# Patient Record
Sex: Female | Born: 1950 | Race: White | Hispanic: No | Marital: Married | Smoking: Former smoker
Health system: Southern US, Community
[De-identification: ages and names within clinical notes are randomized; demographics above are authoritative.]

## PROBLEM LIST (undated history)

## (undated) DIAGNOSIS — C801 Malignant (primary) neoplasm, unspecified: Secondary | ICD-10-CM

## (undated) DIAGNOSIS — I10 Essential (primary) hypertension: Secondary | ICD-10-CM

## (undated) HISTORY — PX: TUBAL LIGATION: SHX77

## (undated) HISTORY — PX: APPENDECTOMY: SHX54

## (undated) HISTORY — PX: COLOSTOMY REVERSAL: SHX5782

## (undated) HISTORY — PX: COLON RESECTION: SHX5231

## (undated) HISTORY — PX: ABDOMINAL HYSTERECTOMY: SHX81

## (undated) HISTORY — PX: SHOULDER ARTHROSCOPY: SHX128

## (undated) HISTORY — PX: COLOSTOMY: SHX63

## (undated) HISTORY — PX: COLON SURGERY: SHX602

---

## 2010-05-29 ENCOUNTER — Emergency Department (HOSPITAL_COMMUNITY)
Admission: EM | Admit: 2010-05-29 | Discharge: 2010-05-29 | Payer: Self-pay | Source: Home / Self Care | Admitting: Emergency Medicine

## 2010-12-01 ENCOUNTER — Other Ambulatory Visit (HOSPITAL_COMMUNITY): Payer: Self-pay | Admitting: *Deleted

## 2010-12-01 DIAGNOSIS — M25519 Pain in unspecified shoulder: Secondary | ICD-10-CM

## 2010-12-04 ENCOUNTER — Inpatient Hospital Stay (HOSPITAL_COMMUNITY)
Admission: RE | Admit: 2010-12-04 | Discharge: 2010-12-04 | Disposition: A | Discharge status: Home / Self Care | Payer: Medicare Other | Source: Ambulatory Visit | Attending: *Deleted | Admitting: *Deleted

## 2010-12-25 ENCOUNTER — Other Ambulatory Visit (HOSPITAL_COMMUNITY): Payer: Medicare Other

## 2011-01-15 ENCOUNTER — Ambulatory Visit (HOSPITAL_COMMUNITY)
Admission: RE | Admit: 2011-01-15 | Discharge: 2011-01-15 | Disposition: A | Discharge status: Home / Self Care | Payer: Medicare Other | Source: Ambulatory Visit | Attending: Diagnostic Radiology | Admitting: Diagnostic Radiology

## 2011-01-15 DIAGNOSIS — M25519 Pain in unspecified shoulder: Secondary | ICD-10-CM

## 2011-01-15 DIAGNOSIS — S46819A Strain of other muscles, fascia and tendons at shoulder and upper arm level, unspecified arm, initial encounter: Secondary | ICD-10-CM | POA: Insufficient documentation

## 2011-01-15 DIAGNOSIS — X58XXXA Exposure to other specified factors, initial encounter: Secondary | ICD-10-CM | POA: Insufficient documentation

## 2011-01-15 DIAGNOSIS — M25419 Effusion, unspecified shoulder: Secondary | ICD-10-CM | POA: Insufficient documentation

## 2011-01-15 DIAGNOSIS — M25619 Stiffness of unspecified shoulder, not elsewhere classified: Secondary | ICD-10-CM | POA: Insufficient documentation

## 2011-03-10 ENCOUNTER — Ambulatory Visit (HOSPITAL_COMMUNITY)
Admission: RE | Admit: 2011-03-10 | Discharge: 2011-03-10 | Disposition: A | Discharge status: Home / Self Care | Payer: Medicare Other | Source: Ambulatory Visit | Attending: Specialist | Admitting: Specialist

## 2011-03-10 DIAGNOSIS — M25519 Pain in unspecified shoulder: Secondary | ICD-10-CM | POA: Insufficient documentation

## 2011-03-10 DIAGNOSIS — M25619 Stiffness of unspecified shoulder, not elsewhere classified: Secondary | ICD-10-CM | POA: Insufficient documentation

## 2011-03-10 DIAGNOSIS — M75120 Complete rotator cuff tear or rupture of unspecified shoulder, not specified as traumatic: Secondary | ICD-10-CM | POA: Insufficient documentation

## 2011-03-10 DIAGNOSIS — M6281 Muscle weakness (generalized): Secondary | ICD-10-CM | POA: Insufficient documentation

## 2011-03-10 DIAGNOSIS — IMO0001 Reserved for inherently not codable concepts without codable children: Secondary | ICD-10-CM | POA: Insufficient documentation

## 2011-03-10 NOTE — Progress Notes (Signed)
Occupational Therapy Evaluation  Patient Name: GWYNETH FERNANDEZ BJYNW'G Date: 03/10/2011 Time In 100 Time Out 137 OT Evaluation 100-120 Manual Therapy 121-137  Visit 1/24  Reassessment date: 03/911 Diagnosis:  S/P Left Rotator Cuff Repair, 02/05/11 Referring MD:  Dr. Mack Guise  S:  I fell on the front porch in January, and injured my right shoulder."  Pertinent History:  Patient fell and ruptured her left rotator cuff in January.  She had surgery on 02/05/11 to repair her left rotator cuff.  She has been referred to occupational therapy  for evaluation and treatment.   Pain Assessment Currently in Pain?: Yes Pain Score:   2 Pain Location: Shoulder Pain Orientation: Left Pain Type: Acute pain  Precautions/Restrictions  Precautions Precautions: Shoulder (RCR on 02/05/11.  AAROM only until 03/24/11)  O:  Assessment LUE Assessment LUE Assessment:  (assessed in supine, ER and IR with shoulder adducted) LUE AROM (degrees) Left Shoulder Flexion  0-170: 0 Degrees Left Shoulder ABduction 0-40: 60 Degrees Left Shoulder Internal Rotation  0-70: 70 Degrees Left Shoulder External Rotation  0-90: 12 Degrees LUE PROM (degrees) Left Shoulder Flexion  0-170: 75 Degrees Left Shoulder ABduction 0-40: 85 Degrees Left Shoulder Internal Rotation  0-70: 70 Degrees Left Shoulder External Rotation  0-90: 30 Degrees LUE Strength LUE Overall Strength:  (not assessed due to recent surgery)   Exercise/Treatments  Shoulder Exercises Shoulder Flexion: Supine;PROM;10 reps Shoulder Protraction: Supine;PROM;10 reps Shoulder Horizontal ABduction: Supine;PROM;10 reps Shoulder ABduction: Supine;PROM;10 reps Shoulder External Rotation: Supine;PROM;10 reps Shoulder Internal Rotation: Supine;PROM;10 reps Shoulder Stretch Wall Stretch:  (educated on for hep) Table Stretch - Flexion:  (educated on for HEP) Table Stretch - Abduction:  (educated on for HEP)  Manual Therapy Manual Therapy:  Myofascial release Myofascial Release: MFR and manual stretching to left upper arm, scapular and pectoral region to decrease pain and fascial restrictions and increase pain free mobility.   Goals Short Term Goals (4 weeks) Short Term Goal 1: Patient will be I with HEP. Short Term Goal 2: Patient will increase PROM to [facility] for increased independence using her left arm with functional activities. Short Term Goal 3: Patient will increase left shoulder strength to 3+/5 for increased independence lifting dishes when washing them. Short Term Goal 4: Patient will decrease pain to 1/10 in her left shoulder. Short Term Goal 5: Patient will decrease fascial restrictions from mod-max to mod. Long Term Goals (8 weeks) Long Term Goal 1: Patient will return to prior level of independence with all functional activities. Long Term Goal 2: Patient will increase left shoulder AROM to [facility] for increased ability to reach into overhead cabinets.   Long Term Goal 3: Patient will increase left shoulder strength to 5/5 for increased ability to complete gardening tasks. Long Term Goal 4: Patient will decrease pain to 0/10 in her left shoulder when completing daily activities. Long Term Goal 5: Patient will decrease fascial restrictions to trace in her left shoulder. End of Session Patient Active Problem List  Diagnoses  . Pain in joint, shoulder region  . Complete rupture of rotator cuff  . Muscle weakness (generalized)   End of Session Activity Tolerance: Patient tolerated treatment well General Behavior During Session: [facility] for tasks performed Cognition: [facility] for tasks performed OT Assessment and Plan Clinical Impression Statement: A:  Patient presents with decreased ROM, strength, and functional use of LUE due to increased pain, restrictions, and recent surgery.   Prognosis: Excellent OT Frequency: Min 3X/week OT Duration: 8 weeks OT Treatment/Interventions: Therapeutic  exercise;Self-care/ADL training;Manual  therapy;Therapeutic activities;Patient/family education;Other (comment) (modalities as needed)  P:  Skilled occupational therapy to decrease pain and restrictions and increase mobility and strength to [facility] in left shoulder for return to prior level of independence with daily activities.  Treatment Plan:  Manual therapy including MFR, scar release, and joint mobilization.  Ther Ex:  supine PROM, dowel for shoulder protraction, horizontal abduction, external rotation, internal rotation, flexion, and abduction.  isometric strengthening to left shoulder in supine.  bridges.  seated elevation, retraction, extension.  ball stretch into flexion and abduction.  pulleys.  Progress to AROM after 03/24/11.   Time Calculation Start Time: 1300 Stop Time: 1337 Time Calculation (min): 37 min  Shirlean Mylar, OTR/L  03/10/2011, 4:42 PM

## 2011-03-16 ENCOUNTER — Ambulatory Visit (HOSPITAL_COMMUNITY)
Admission: RE | Admit: 2011-03-16 | Discharge: 2011-03-16 | Disposition: A | Discharge status: Home / Self Care | Payer: Medicare Other | Source: Ambulatory Visit | Attending: *Deleted | Admitting: *Deleted

## 2011-03-16 NOTE — Progress Notes (Signed)
Occupational Therapy Treatment  Patient Details  Name: Caitlin Mckinney MRN: [id number] Date of Birth: May 17, 1951  Today's Date: 03/16/2011 Time: 4098-1191 Time Calculation (min): 57 min Manual Therapy 104-137 Therapeutic exercise 137-201 Visit 2/24  Reassessment date: 03/911  Diagnosis: S/P Left Rotator Cuff Repair, 02/05/11  Referring MD: Dr. Mack Guise   Subjective Symptoms/Limitations Symptoms: I am good, no pain. Pain Assessment Currently in Pain?: No/denies Pain Score: 0-No pain     Exercise/Treatments   03/16/11 1544  Shoulder Exercises: Supine  Protraction AAROM;10 reps (dowel and hand and eccentric contraction)  Horizontal ABduction AAROM;10 reps  External Rotation AAROM;10 reps  Internal Rotation AAROM;10 reps  Flexion AAROM;10 reps (dowel and hand held with eccentric contraction)  ABduction AAROM;10 reps  Other Supine Exercises isometrics x5 with 5" hold flexion, extension, abduction, adduction, internal rotation, external rotation  Shoulder Exercises: Seated  Elevation AROM;10 reps  Extension AROM;10 reps  Retraction AROM;10 reps  Row AROM;10 reps  Shoulder Exercises: Pulleys  Flexion 2 minutes  ABduction 2 minutes  Shoulder Exercises: Therapy Ball  Flexion 20 reps  ABduction 20 reps    03/16/11 1310  Manual Therapy  Manual Therapy Myofascial release  Myofascial Release MFR and manual stretching to left upper arm, scapular and pectoral region to decrease pain and fascial restrictions and increase pain free mobility.    Goals Short Term Goals Short Term Goal 1: Patient will be I with HEP. Short Term Goal 1 Progress: Progressing toward goal Short Term Goal 2: Patient will increase PROM to [facility] for increased independence using her left arm with functional activities. Short Term Goal 2 Progress: Progressing toward goal Short Term Goal 3: Patient will increase left shoulder strength to 3+/5 for increased independence lifting dishes when  washing them. Short Term Goal 3 Progress: Progressing toward goal Short Term Goal 4: Patient will decrease pain to 1/10 in her left shoulder. Short Term Goal 4 Progress: Progressing toward goal Short Term Goal 5: Patient will decrease fascial restrictions from mod-max to mod. Short Term Goal 5 Progress: Progressing toward goal Long Term Goals Long Term Goal 1: Patient will return to prior level of independence with all functional activities. Long Term Goal 1 Progress: Progressing toward goal Long Term Goal 2: Patient will increase left shoulder AROM to [facility] for increased ability to reach into overhead cabinets.   Long Term Goal 2 Progress: Progressing toward goal Long Term Goal 3: Patient will increase left shoulder strength to 5/5 for increased ability to complete gardening tasks. Long Term Goal 3 Progress: Progressing toward goal Long Term Goal 4: Patient will decrease pain to 0/10 in her left shoulder when completing daily activities. Long Term Goal 4 Progress: Progressing toward goal Long Term Goal 5: Patient will decrease fascial restrictions to trace in her left shoulder. Long Term Goal 5 Progress: Progressing toward goal End of Session Patient Active Problem List  Diagnoses  . Pain in joint, shoulder region  . Complete rupture of rotator cuff  . Muscle weakness (generalized)   End of Session Activity Tolerance: Patient tolerated treatment well OT Assessment and Plan Clinical Impression Statement: Added multiple new exercises which patient tolerated well with mild increase in pain 2/10, but diminished quickly.  Pt's ROM very limited and required [facility] assist with AAROM with dowel. Prognosis: Good OT Plan: Attempt to decrease amount of HOH assist with AAROM.   Vencent Hauschild L. Orlandis Sanden, COTA/L  03/16/2011, 3:57 PM

## 2011-03-18 ENCOUNTER — Ambulatory Visit (HOSPITAL_COMMUNITY)
Admission: RE | Admit: 2011-03-18 | Discharge: 2011-03-18 | Disposition: A | Discharge status: Home / Self Care | Payer: Medicare Other | Source: Ambulatory Visit | Attending: Specialist | Admitting: Specialist

## 2011-03-18 DIAGNOSIS — M25519 Pain in unspecified shoulder: Secondary | ICD-10-CM

## 2011-03-18 DIAGNOSIS — M6281 Muscle weakness (generalized): Secondary | ICD-10-CM

## 2011-03-18 DIAGNOSIS — M75120 Complete rotator cuff tear or rupture of unspecified shoulder, not specified as traumatic: Secondary | ICD-10-CM

## 2011-03-18 NOTE — Progress Notes (Signed)
Occupational Therapy Treatment  Patient Details  Name: Caitlin Mckinney MRN: [id number] Date of Birth: [birth year]  Today's Date: 03/18/2011 Time: 4098-1191 Time Calculation (min): 48 min Visit#: 3  of 24   Re-eval: 04/07/11  Manual Therapy 323-350 (27') Therapeutic exercise  350-411 (21') Diagnosis: S/P Left Rotator Cuff Repair, 02/05/11  Referring MD: Dr. Mack Guise    Subjective Symptoms/Limitations Symptoms: I was a little sore but I feel good now. Pain Assessment Currently in Pain?: Yes Pain Score:   1 Pain Location: Shoulder Pain Orientation: Left Pain Type: Other (Comment) (intermittent, with certain movements)    Exercise/Treatments Supine Protraction: PROM;10 reps;AAROM;12 reps Horizontal ABduction: PROM;10 reps;AAROM;12 reps External Rotation: PROM;10 reps;AAROM;12 reps Internal Rotation: PROM;10 reps;AAROM;12 reps Flexion: PROM;10 reps;AAROM;15 reps ABduction: PROM;10 reps;AROM;15 reps Seated Elevation: AROM;10 reps Extension: AROM;10 reps Retraction: AROM;10 reps Row: AROM;10 reps Pulleys Flexion: 2 minutes ABduction: 2 minutes Therapy Ball Flexion: 20 reps ABduction: 20 reps ROM / Strengthening / Isometric Strengthening Flexion: Supine;5X5" Extension: Supine;5X5" External Rotation: Supine;5X5" Internal Rotation: Supine;5X5" ABduction: Supine;5X5" ADduction: Supine;5X5"   Manual Therapy Manual Therapy: Myofascial release Myofascial Release: MFR and manual stretching to left upper arm, scapular and pectoral region to decrease pain and fascial restrictions and increase pain free mobility.  Occupational Therapy Assessment and Plan OT Assessment and Plan Clinical Impression Statement: PROM remains limited, capsule feels tight with firm end range. Rehab Potential: Good OT Plan: Continue to increase PROM as patient tolerates   Goals Short Term Goals Short Term Goal 1: Patient will be I with HEP. Short Term Goal 2: Patient will increase  PROM to [facility] for increased independence using her left arm with functional activities. Short Term Goal 3: Patient will increase left shoulder strength to 3+/5 for increased independence lifting dishes when washing them. Short Term Goal 4: Patient will decrease pain to 1/10 in her left shoulder. Short Term Goal 5: Patient will decrease fascial restrictions from mod-max to mod. Long Term Goals Long Term Goal 1: Patient will return to prior level of independence with all functional activities. Long Term Goal 2: Patient will increase left shoulder AROM to [facility] for increased ability to reach into overhead cabinets.   Long Term Goal 3: Patient will increase left shoulder strength to 5/5 for increased ability to complete gardening tasks. Long Term Goal 4: Patient will decrease pain to 0/10 in her left shoulder when completing daily activities. Long Term Goal 5: Patient will decrease fascial restrictions to trace in her left shoulder. End of Session Patient Active Problem List  Diagnoses  . Pain in joint, shoulder region  . Complete rupture of rotator cuff  . Muscle weakness (generalized)   End of Session Activity Tolerance: Patient tolerated treatment well   Avontae Burkhead L. Noralee Stain, COTA/L  03/18/2011, 6:32 PM

## 2011-03-20 ENCOUNTER — Telehealth (HOSPITAL_COMMUNITY): Payer: Self-pay | Admitting: Occupational Therapy

## 2011-03-20 ENCOUNTER — Ambulatory Visit (HOSPITAL_COMMUNITY): Payer: Medicare Other | Admitting: Occupational Therapy

## 2011-03-23 ENCOUNTER — Ambulatory Visit (HOSPITAL_COMMUNITY): Payer: Medicare Other | Admitting: Occupational Therapy

## 2011-03-25 ENCOUNTER — Telehealth (HOSPITAL_COMMUNITY): Payer: Self-pay | Admitting: Specialist

## 2011-03-25 ENCOUNTER — Ambulatory Visit (HOSPITAL_COMMUNITY): Payer: Medicare Other | Admitting: Specialist

## 2011-03-25 NOTE — Telephone Encounter (Signed)
Patient missed scheduled appt.  I called and left a message regarding her missed appointment on her answering machine.

## 2011-03-27 ENCOUNTER — Ambulatory Visit (HOSPITAL_COMMUNITY)
Admission: RE | Admit: 2011-03-27 | Discharge: 2011-03-27 | Disposition: A | Discharge status: Home / Self Care | Payer: Medicare Other | Source: Ambulatory Visit | Attending: Family Medicine | Admitting: Family Medicine

## 2011-03-27 DIAGNOSIS — M75120 Complete rotator cuff tear or rupture of unspecified shoulder, not specified as traumatic: Secondary | ICD-10-CM

## 2011-03-27 DIAGNOSIS — M25519 Pain in unspecified shoulder: Secondary | ICD-10-CM

## 2011-03-27 DIAGNOSIS — M6281 Muscle weakness (generalized): Secondary | ICD-10-CM

## 2011-03-27 NOTE — Progress Notes (Signed)
Occupational Therapy Treatment  Patient Details  Name: Caitlin Mckinney MRN: [id number] Date of Birth: [birth year]  Today's Date: 03/27/2011 Time: 4098-1191 Visit#: 4  of 24   Re-eval: 04/07/11 Manual Therapy 105-136 31' Therapeutic exercise 136-158 21' Diagnosis: S/P Left Rotator Cuff Repair, 02/05/11  Referring MD: Dr. Mack Guise    Subjective Symptoms/Limitations Symptoms: I have been working it some, I am sore.  Exercise/Treatments Supine Protraction: PROM;10 reps;AAROM;15 reps Horizontal ABduction: PROM;10 reps;AAROM;15 reps External Rotation: PROM;10 reps;AAROM;15 reps Internal Rotation: PROM;10 reps;AAROM;15 reps Flexion: PROM;10 reps;AAROM;15 reps ABduction: PROM;10 reps;AAROM;15 reps Other Supine Exercises: isometrics x5 with 5" hold flexion, extension, abduction, adduction, internal rotation, external rotation Seated Elevation: AROM;10 reps Extension: AROM;10 reps Retraction: AROM;10 reps Row: AROM;10 reps Pulleys Flexion: 2 minutes ABduction: 2 minutes Therapy Ball Flexion: 25 reps ABduction: 25 reps ROM / Strengthening / Isometric Strengthening Flexion: Supine;5X5" Extension: Supine;5X5" External Rotation: Supine;5X5" Internal Rotation: Supine;5X5" ABduction: Supine;5X5" ADduction: Supine;5X5"  Manual Therapy Manual Therapy: Myofascial release Myofascial Release: MFR and manual stretching to left upper arm, scapular and pectoral region to decrease pain and fascial restrictions and increase pain free mobility.  Occupational Therapy Assessment and Plan OT Assessment and Plan Clinical Impression Statement: Patient required less assist with supine AROM but joint remains very tight.  Good hystamine rection in pect area after manuel OT Plan: Continue to decrease restrictions and increase ROM   Goals Short Term Goals Short Term Goal 1: Patient will be I with HEP. Short Term Goal 2: Patient will increase PROM to [facility] for increased independence using  her left arm with functional activities. Short Term Goal 3: Patient will increase left shoulder strength to 3+/5 for increased independence lifting dishes when washing them. Short Term Goal 4: Patient will decrease pain to 1/10 in her left shoulder. Short Term Goal 5: Patient will decrease fascial restrictions from mod-max to mod. Long Term Goals Long Term Goal 1: Patient will return to prior level of independence with all functional activities. Long Term Goal 2: Patient will increase left shoulder AROM to [facility] for increased ability to reach into overhead cabinets.   Long Term Goal 3: Patient will increase left shoulder strength to 5/5 for increased ability to complete gardening tasks. Long Term Goal 4: Patient will decrease pain to 0/10 in her left shoulder when completing daily activities. Long Term Goal 5: Patient will decrease fascial restrictions to trace in her left shoulder. End of Session Patient Active Problem List  Diagnoses  . Pain in joint, shoulder region  . Complete rupture of rotator cuff  . Muscle weakness (generalized)   End of Session Activity Tolerance: Patient tolerated treatment well General Behavior During Session: [facility] for tasks performed Cognition: [facility] for tasks performed   Elenor Wildes L. Noralee Stain, COTA/L  03/27/2011, 4:02 PM

## 2011-03-30 ENCOUNTER — Telehealth (HOSPITAL_COMMUNITY): Payer: Self-pay

## 2011-03-30 ENCOUNTER — Ambulatory Visit (HOSPITAL_COMMUNITY): Payer: Medicare Other | Admitting: Occupational Therapy

## 2011-04-01 ENCOUNTER — Ambulatory Visit (HOSPITAL_COMMUNITY)
Admission: RE | Admit: 2011-04-01 | Discharge: 2011-04-01 | Disposition: A | Discharge status: Home / Self Care | Payer: Medicare Other | Source: Ambulatory Visit | Attending: Family Medicine | Admitting: Family Medicine

## 2011-04-01 DIAGNOSIS — M25519 Pain in unspecified shoulder: Secondary | ICD-10-CM

## 2011-04-01 DIAGNOSIS — IMO0001 Reserved for inherently not codable concepts without codable children: Secondary | ICD-10-CM | POA: Insufficient documentation

## 2011-04-01 DIAGNOSIS — M25619 Stiffness of unspecified shoulder, not elsewhere classified: Secondary | ICD-10-CM | POA: Insufficient documentation

## 2011-04-01 DIAGNOSIS — M6281 Muscle weakness (generalized): Secondary | ICD-10-CM

## 2011-04-01 DIAGNOSIS — M75120 Complete rotator cuff tear or rupture of unspecified shoulder, not specified as traumatic: Secondary | ICD-10-CM

## 2011-04-01 NOTE — Progress Notes (Signed)
Occupational Therapy Treatment  Patient Details  Name: Caitlin Mckinney MRN: [id number] Date of Birth: [birth year]  Today's Date: 04/01/2011 Time: 1101-1150 Time Calculation (min): 49 min Manual Therapy 1191-4782 18' Therapeutic Exercises 1120-1150 30'  Visit#: 5  of 24   Re-eval: 04/29/11    Subjective Symptoms/Limitations Symptoms: S:  It is still really tight, but I know that is to be expected. Pain Assessment Pain Score:   1 Pain Location: Shoulder Pain Orientation: Left Pain Type: Acute pain   Exercise/Treatments  04/01/11 0700  Shoulder Exercises: Supine  Protraction PROM;AROM;10 reps;AAROM;15 reps  Horizontal ABduction PROM;AROM;10 reps;AAROM;15 reps  External Rotation PROM;AROM;10 reps;AAROM;15 reps  Internal Rotation PROM;AROM;10 reps;AAROM;15 reps  Flexion PROM;AROM;10 reps;AAROM;15 reps  ABduction PROM;AROM;10 reps;AAROM;15 reps  Shoulder Exercises: Seated  Elevation AROM;12 reps  Extension AROM;12 reps  Retraction AROM;12 reps  Row AROM;12 reps  Shoulder Exercises: Pulleys  Flexion 2 minutes  ABduction 2 minutes  Shoulder Exercises: Therapy Ball  Flexion 25 reps  ABduction 25 reps  Shoulder Exercises: ROM/Strengthening  Wall Wash begin next visit  Thumb Tacks 1 min  Prot/Ret//Elev/Dep 1 min   Manual Therapy Manual Therapy: Myofascial release Myofascial Release: MFR and manual stretching to left upper arm, scapular, and pectoral region to decrease pain and fascial restrictions and increase pain free mobility in left shoulder region.   Scapular Mobilization: in seated scapular mobilization to increase ability for left scapula to glide while reaching overhead.  Occupational Therapy Assessment and Plan OT Assessment and Plan Clinical Impression Statement: Please see progress note for progress. OT Plan: P:  Continue to increase A/PROM to [facility] for full participation in daily activities.  Attempt wall wash.    Goals Short Term Goals Short Term Goal 1:  Patient will be I with HEP. Short Term Goal 1 Progress: Progressing toward goal Short Term Goal 2: Patient will increase PROM to [facility] for increased independence using her left arm with functional activities. Short Term Goal 2 Progress: Progressing toward goal Short Term Goal 3: Patient will increase left shoulder strength to 3+/5 for increased independence lifting dishes when washing them. Short Term Goal 3 Progress: Progressing toward goal Short Term Goal 4: Patient will decrease pain to 1/10 in her left shoulder. Short Term Goal 4 Progress: Met Short Term Goal 5: Patient will decrease fascial restrictions from mod-max to mod. Short Term Goal 5 Progress: Met Long Term Goals Long Term Goal 1: Patient will return to prior level of independence with all functional activities. Long Term Goal 1 Progress: Progressing toward goal Long Term Goal 2: Patient will increase left shoulder AROM to [facility] for increased ability to reach into overhead cabinets.   Long Term Goal 2 Progress: Progressing toward goal Long Term Goal 3: Patient will increase left shoulder strength to 5/5 for increased ability to complete gardening tasks. Long Term Goal 3 Progress: Progressing toward goal Long Term Goal 4: Patient will decrease pain to 0/10 in her left shoulder when completing daily activities. Long Term Goal 4 Progress: Progressing toward goal Long Term Goal 5: Patient will decrease fascial restrictions to trace in her left shoulder. Long Term Goal 5 Progress: Progressing toward goal End of Session Patient Active Problem List  Diagnoses  . Pain in joint, shoulder region  . Complete rupture of rotator cuff  . Muscle weakness (generalized)   End of Session Activity Tolerance: Patient tolerated treatment well General Behavior During Session: [facility] for tasks performed Cognition: [facility] for tasks performed   Shirlean Mylar, OTR/L  04/01/2011,  11:54 AM

## 2011-04-03 ENCOUNTER — Ambulatory Visit (HOSPITAL_COMMUNITY): Payer: Medicare Other | Admitting: Occupational Therapy

## 2011-04-03 ENCOUNTER — Telehealth (HOSPITAL_COMMUNITY): Payer: Self-pay | Admitting: Occupational Therapy

## 2011-04-06 ENCOUNTER — Ambulatory Visit (HOSPITAL_COMMUNITY)
Admission: RE | Admit: 2011-04-06 | Discharge: 2011-04-06 | Disposition: A | Discharge status: Home / Self Care | Payer: Medicare Other | Source: Ambulatory Visit | Attending: Family Medicine | Admitting: Family Medicine

## 2011-04-06 DIAGNOSIS — M6281 Muscle weakness (generalized): Secondary | ICD-10-CM

## 2011-04-06 DIAGNOSIS — M25519 Pain in unspecified shoulder: Secondary | ICD-10-CM

## 2011-04-06 DIAGNOSIS — M75120 Complete rotator cuff tear or rupture of unspecified shoulder, not specified as traumatic: Secondary | ICD-10-CM

## 2011-04-06 NOTE — Progress Notes (Signed)
Occupational Therapy Treatment  Patient Details  Name: Caitlin Mckinney MRN: [id number] Date of Birth: [birth year]  Today's Date: 04/06/2011 Time: 1102-1205 Time Calculation (min): 63 min Visit#: 6  of 24   Re-eval: 04/29/11 [facility]  4098-1191  29' Therapeutic Exercise [id number]  33'  Subjective Symptoms/Limitations Symptoms: The doctor said if I don't get it looser he is going to manipulate it. Pain Assessment Currently in Pain?: No/denies  Exercise/Treatments Supine Protraction: PROM;10 reps;AROM;12 reps;AAROM;15 reps Horizontal ABduction: PROM;10 reps;AROM;12 reps;AAROM;15 reps External Rotation: PROM;10 reps;AROM;12 reps;AAROM;15 reps Internal Rotation: PROM;10 reps;AROM;12 reps;AAROM;15 reps Flexion: PROM;10 reps;AROM;12 reps;AAROM;15 reps ABduction: PROM;10 reps;AROM;12 reps;AAROM;15 reps Seated Elevation: AROM;12 reps Extension: AROM;12 reps Retraction: AROM;12 reps Row: AROM;12 reps Therapy Ball Flexion: 25 reps ABduction: 25 reps ROM / Strengthening / Isometric Strengthening Wall Wash: 2 min. Thumb Tacks: 2 min Prot/Ret//Elev/Dep: 2 min  Flexion:  (d/c) Extension:  (d/c) External Rotation:  (d/c) Internal Rotation:  (d/c) ABduction:  (d/c) ADduction:  (d/c)   Manual Therapy Manual Therapy: Myofascial release Myofascial Release: MFR and manual stretching to left upper arm, scapular and pectoral region to decrease pain and fascial restrictions and increase pain free mobility.  Occupational Therapy Assessment and Plan OT Assessment and Plan Clinical Impression Statement: d/c isometrics and suggested patient get a set of pullies for home program. Added wall wash for 2'. Rehab Potential: Good OT Plan: Increase time with wall wash.   Goals Short Term Goals Short Term Goal 1: Patient will be I with HEP. Short Term Goal 2: Patient will increase PROM to [facility] for increased independence using her left arm with functional activities. Short Term Goal 3:  Patient will increase left shoulder strength to 3+/5 for increased independence lifting dishes when washing them. Short Term Goal 4: Patient will decrease pain to 1/10 in her left shoulder. Short Term Goal 5: Patient will decrease fascial restrictions from mod-max to mod. Long Term Goals Long Term Goal 1: Patient will return to prior level of independence with all functional activities. Long Term Goal 2: Patient will increase left shoulder AROM to [facility] for increased ability to reach into overhead cabinets.   Long Term Goal 3: Patient will increase left shoulder strength to 5/5 for increased ability to complete gardening tasks. Long Term Goal 4: Patient will decrease pain to 0/10 in her left shoulder when completing daily activities. Long Term Goal 5: Patient will decrease fascial restrictions to trace in her left shoulder. End of Session Patient Active Problem List  Diagnoses  . Pain in joint, shoulder region  . Complete rupture of rotator cuff  . Muscle weakness (generalized)   End of Session Activity Tolerance: Patient tolerated treatment well   Acea Yagi L. Lofton Leon, COTA/L  04/06/2011, 12:08 PM

## 2011-04-08 ENCOUNTER — Telehealth (HOSPITAL_COMMUNITY): Payer: Self-pay

## 2011-04-08 ENCOUNTER — Ambulatory Visit (HOSPITAL_COMMUNITY): Payer: Medicare Other | Admitting: Occupational Therapy

## 2011-04-10 ENCOUNTER — Ambulatory Visit (HOSPITAL_COMMUNITY)
Admission: RE | Admit: 2011-04-10 | Discharge: 2011-04-10 | Disposition: A | Discharge status: Home / Self Care | Payer: Medicare Other | Source: Ambulatory Visit | Attending: Family Medicine | Admitting: Family Medicine

## 2011-04-10 DIAGNOSIS — M6281 Muscle weakness (generalized): Secondary | ICD-10-CM

## 2011-04-10 DIAGNOSIS — M75120 Complete rotator cuff tear or rupture of unspecified shoulder, not specified as traumatic: Secondary | ICD-10-CM

## 2011-04-10 DIAGNOSIS — M25519 Pain in unspecified shoulder: Secondary | ICD-10-CM

## 2011-04-10 NOTE — Progress Notes (Signed)
Occupational Therapy Treatment  Patient Details  Name: Caitlin Mckinney MRN: [id number] Date of Birth: 1951/05/01  Today's Date: 04/10/2011 Time: 2130-8657 Time Calculation (min): 53 min Visit#: 7  of 24   Re-eval: 04/29/11 [facility] Therapy [phone]  95' Therapeutic Exercise [phone]  26'    Subjective Symptoms/Limitations Symptoms: S:  I have been working this arm.   Pain Assessment Currently in Pain?: Yes Pain Score:   3 Pain Location: Shoulder Pain Orientation: Left Pain Type: Acute pain   Exercise/Treatments Supine Protraction: PROM;10 reps;AROM;15 reps Horizontal ABduction: PROM;10 reps;AROM;15 reps External Rotation: PROM;10 reps;AROM;15 reps Internal Rotation: PROM;10 reps;AROM;15 reps Flexion: PROM;10 reps;AROM;15 reps ABduction: PROM;10 reps;AROM;15 reps Seated Elevation: AROM;15 reps Extension: AROM;15 reps Retraction: AROM;15 reps Row: AROM;15 reps Pulleys Flexion:  (d/c) ABduction:  (d/c) Therapy Ball Flexion: 25 reps ABduction: 25 reps Right/Left: 5 reps ROM / Strengthening / Isometric Strengthening UBE (Upper Arm Bike): 3' forward and 3' reverse Wall Wash: 3' Thumb Tacks: 2 min Prot/Ret//Elev/Dep: 2 min   Manual Therapy Manual Therapy: Myofascial release Myofascial Release: MFR and manual stretching to left upper arm, scapular and pectoral region to decrease pain and fascial restrictions and increase pain free mobility  Occupational Therapy Assessment and Plan OT Assessment and Plan Clinical Impression Statement: A:  Added ball circles and UBE, d/c pullies.  Patient tolerated circles and UBE well. Rehab Potential: Good OT Plan: P:  Attempt to increse wall wash to 2.5 minutes.   Goals Short Term Goals Short Term Goal 1: Patient will be I with HEP. Short Term Goal 2: Patient will increase PROM to [facility] for increased independence using her left arm with functional activities. Short Term Goal 3: Patient will increase left shoulder strength to 3+/5  for increased independence lifting dishes when washing them. Short Term Goal 4: Patient will decrease pain to 1/10 in her left shoulder. Short Term Goal 5: Patient will decrease fascial restrictions from mod-max to mod. Long Term Goals Long Term Goal 1: Patient will return to prior level of independence with all functional activities. Long Term Goal 2: Patient will increase left shoulder AROM to [facility] for increased ability to reach into overhead cabinets.   Long Term Goal 3: Patient will increase left shoulder strength to 5/5 for increased ability to complete gardening tasks. Long Term Goal 4: Patient will decrease pain to 0/10 in her left shoulder when completing daily activities. Long Term Goal 5: Patient will decrease fascial restrictions to trace in her left shoulder. End of Session Patient Active Problem List  Diagnoses  . Pain in joint, shoulder region  . Complete rupture of rotator cuff  . Muscle weakness (generalized)   End of Session Activity Tolerance: Patient tolerated treatment well General Behavior During Session: [facility] for tasks performed Cognition: [facility] for tasks performed   Shantanu Strauch L. Sicilia Killough, COTA/L  04/10/2011, 4:58 PM

## 2011-04-16 ENCOUNTER — Ambulatory Visit (HOSPITAL_COMMUNITY): Payer: Medicare Other | Admitting: Occupational Therapy

## 2011-04-20 ENCOUNTER — Ambulatory Visit (HOSPITAL_COMMUNITY): Payer: Medicare Other | Admitting: Occupational Therapy

## 2011-04-20 ENCOUNTER — Telehealth (HOSPITAL_COMMUNITY): Payer: Self-pay

## 2011-04-22 ENCOUNTER — Telehealth (HOSPITAL_COMMUNITY): Payer: Self-pay

## 2011-04-22 ENCOUNTER — Ambulatory Visit (HOSPITAL_COMMUNITY): Payer: Medicare Other | Admitting: Occupational Therapy

## 2011-04-24 ENCOUNTER — Ambulatory Visit (HOSPITAL_COMMUNITY): Payer: Medicare Other | Admitting: Specialist

## 2011-04-24 ENCOUNTER — Telehealth (HOSPITAL_COMMUNITY): Payer: Self-pay | Admitting: Specialist

## 2011-04-27 ENCOUNTER — Telehealth (HOSPITAL_COMMUNITY): Payer: Self-pay

## 2011-04-27 ENCOUNTER — Ambulatory Visit (HOSPITAL_COMMUNITY): Payer: Medicare Other | Admitting: Occupational Therapy

## 2011-04-29 ENCOUNTER — Ambulatory Visit (HOSPITAL_COMMUNITY): Payer: Medicare Other | Admitting: Occupational Therapy

## 2011-05-01 ENCOUNTER — Ambulatory Visit (HOSPITAL_COMMUNITY): Payer: Medicare Other | Admitting: Specialist

## 2012-02-12 DIAGNOSIS — C20 Malignant neoplasm of rectum: Secondary | ICD-10-CM | POA: Insufficient documentation

## 2014-03-27 ENCOUNTER — Ambulatory Visit: Payer: Self-pay | Admitting: Gastroenterology

## 2014-06-06 DIAGNOSIS — H5710 Ocular pain, unspecified eye: Secondary | ICD-10-CM | POA: Insufficient documentation

## 2014-06-06 DIAGNOSIS — R519 Headache, unspecified: Secondary | ICD-10-CM | POA: Insufficient documentation

## 2016-04-10 ENCOUNTER — Encounter (HOSPITAL_COMMUNITY): Payer: Self-pay | Admitting: Cardiology

## 2016-04-10 ENCOUNTER — Emergency Department (HOSPITAL_COMMUNITY): Payer: Medicare Other

## 2016-04-10 ENCOUNTER — Emergency Department (HOSPITAL_COMMUNITY)
Admission: EM | Admit: 2016-04-10 | Discharge: 2016-04-10 | Disposition: A | Discharge status: Home / Self Care | Payer: Medicare Other | Attending: Emergency Medicine | Admitting: Emergency Medicine

## 2016-04-10 DIAGNOSIS — I10 Essential (primary) hypertension: Secondary | ICD-10-CM | POA: Insufficient documentation

## 2016-04-10 DIAGNOSIS — Z79899 Other long term (current) drug therapy: Secondary | ICD-10-CM | POA: Diagnosis not present

## 2016-04-10 DIAGNOSIS — R079 Chest pain, unspecified: Secondary | ICD-10-CM | POA: Diagnosis not present

## 2016-04-10 DIAGNOSIS — Z87891 Personal history of nicotine dependence: Secondary | ICD-10-CM | POA: Diagnosis not present

## 2016-04-10 DIAGNOSIS — R110 Nausea: Secondary | ICD-10-CM | POA: Insufficient documentation

## 2016-04-10 DIAGNOSIS — R51 Headache: Secondary | ICD-10-CM | POA: Diagnosis not present

## 2016-04-10 DIAGNOSIS — R072 Precordial pain: Secondary | ICD-10-CM | POA: Diagnosis present

## 2016-04-10 HISTORY — DX: Essential (primary) hypertension: I10

## 2016-04-10 HISTORY — DX: Malignant (primary) neoplasm, unspecified: C80.1

## 2016-04-10 LAB — CBC
HEMATOCRIT: 41.7 % (ref 36.0–46.0)
Hemoglobin: 14.3 g/dL (ref 12.0–15.0)
MCH: 31.3 pg (ref 26.0–34.0)
MCHC: 34.3 g/dL (ref 30.0–36.0)
MCV: 91.2 fL (ref 78.0–100.0)
PLATELETS: 229 10*3/uL (ref 150–400)
RBC: 4.57 MIL/uL (ref 3.87–5.11)
RDW: 12.4 % (ref 11.5–15.5)
WBC: 4.5 10*3/uL (ref 4.0–10.5)

## 2016-04-10 LAB — BASIC METABOLIC PANEL
Anion gap: 7 (ref 5–15)
BUN: 13 mg/dL (ref 6–20)
CHLORIDE: 106 mmol/L (ref 101–111)
CO2: 24 mmol/L (ref 22–32)
CREATININE: 0.58 mg/dL (ref 0.44–1.00)
Calcium: 8.9 mg/dL (ref 8.9–10.3)
GFR calc Af Amer: >60 mL/min (ref >60)
GFR calc non Af Amer: >60 mL/min (ref >60)
Glucose, Bld: 110 mg/dL — ABNORMAL HIGH (ref 65–99)
POTASSIUM: 4.1 mmol/L (ref 3.5–5.1)
Sodium: 137 mmol/L (ref 135–145)

## 2016-04-10 LAB — TROPONIN I
Troponin I: <0.03 ng/mL (ref <0.03)
Troponin I: <0.03 ng/mL (ref <0.03)

## 2016-04-10 NOTE — Discharge Instructions (Signed)
Return for any chest pain lasting 15 minutes or longer any new or worse symptoms. Make an appointment to follow-up with cardiology.

## 2016-04-10 NOTE — ED Provider Notes (Signed)
[facility] DEPT Provider Note   CSN: [id number] Arrival date & time: 04/10/16  0907  By signing my name below, I, [name], attest that this documentation has been prepared under the direction and in the presence of Fredia Sorrow, MD . Electronically Signed: [name], Scribe. 04/10/2016. 9:36 AM.  History   Chief Complaint Chief Complaint  Patient presents with  . Chest Pain   The history is provided by the patient. No language interpreter was used.  Chest Pain   This is a new problem. The current episode started more than 2 days ago. The problem occurs daily. Associated symptoms include headaches and nausea. Pertinent negatives include no abdominal pain, no back pain, no cough, no fever, no shortness of breath and no vomiting.   HPI Comments: Caitlin Mckinney is a 65 y.o. female who presents to the Emergency Department complaining of intermittent substernal chest pain that started about a week ago. She describes the pain as as 5/10 and having episodes lasting 2-10 minutes. She currently rates the pain as a 2/10. She reports no modifying factors for pain. She reports associated symptoms of nausea, fatigue, and headache. She denies any prior hx of similar compliant, vomiting, or SOB.     PCP: [facility]  Past Medical History:  Diagnosis Date  . Cancer [facility])    endometrial and colon 2010  . Hypertension     Patient Active Problem List   Diagnosis Date Noted  . Pain in joint, shoulder region 03/10/2011  . Complete rupture of rotator cuff 03/10/2011  . Muscle weakness (generalized) 03/10/2011    Past Surgical History:  Procedure Laterality Date  . ABDOMINAL HYSTERECTOMY    . APPENDECTOMY    . COLON RESECTION    . COLOSTOMY    . COLOSTOMY REVERSAL    . SHOULDER ARTHROSCOPY    . TUBAL LIGATION      OB History    No data available       Home Medications    Prior to Admission medications   Medication Sig Start Date End  Date Taking? Authorizing Provider  atenolol (TENORMIN) 100 MG tablet Take 100 mg by mouth daily.   Yes Historical Provider, MD  cholecalciferol (VITAMIN D) 1000 units tablet Take 1,000 Units by mouth daily.   Yes Historical Provider, MD  escitalopram (LEXAPRO) 20 MG tablet Take 20 mg by mouth daily.   Yes Historical Provider, MD  fluticasone (FLONASE) 50 MCG/ACT nasal spray Place 1 spray into both nostrils daily as needed for allergies or rhinitis.   Yes Historical Provider, MD  GUAIFENESIN PO Take 1 tablet by mouth daily as needed.   Yes Historical Provider, MD  Hypromellose (ARTIFICIAL TEARS) 0.4 % SOLN Apply 2 drops to eye 3 (three) times daily.   Yes Historical Provider, MD  loratadine (CLARITIN) 10 MG tablet Take 10 mg by mouth daily.   Yes Historical Provider, MD  Magnesium 250 MG TABS Take 250-500 mg by mouth at bedtime.   Yes Historical Provider, MD  methylphenidate (RITALIN) 10 MG tablet Take 10 mg by mouth daily.   Yes Historical Provider, MD  omeprazole (PRILOSEC) 20 MG capsule Take 1 capsule by mouth daily.   Yes Historical Provider, MD  potassium chloride SA (K-DUR,KLOR-CON) 20 MEQ tablet Take 1 tablet by mouth daily.   Yes Historical Provider, MD    Family History History reviewed. No pertinent family history.  Social History Social History  Substance Use Topics  . Smoking status: Former  Smoker  . Smokeless tobacco: Never Used  . Alcohol use No     Allergies   Contrast media [iodinated diagnostic agents]; Other; Allevess [capsaicin-menthol]; Erythromycin; and Naproxen   Review of Systems Review of Systems  Constitutional: Positive for fatigue. Negative for chills and fever.  HENT: Negative for congestion, rhinorrhea and sore throat.   Eyes: Negative for visual disturbance.  Respiratory: Negative for cough and shortness of breath.   Cardiovascular: Positive for chest pain (Substernal). Negative for leg swelling.  Gastrointestinal: Positive for nausea. Negative for  abdominal pain, diarrhea and vomiting.  Genitourinary: Negative for dysuria.  Musculoskeletal: Negative for back pain.  Skin: Negative for rash.  Neurological: Positive for headaches.  Hematological: Does not bruise/bleed easily.  Psychiatric/Behavioral: Negative for confusion.     Physical Exam Updated Vital Signs BP 144/61   Pulse 62   Temp 98.5 F (36.9 C) (Oral)   Resp 18   Ht '5\' 2"'$  (1.575 m)   Wt 168 lb (76.2 kg)   SpO2 99%   BMI 30.73 kg/m   Physical Exam  Constitutional: She appears well-developed and well-nourished. No distress.  HENT:  Head: Normocephalic and atraumatic.  Mucous membranes are moist.   Eyes: Conjunctivae and EOM are normal. Pupils are equal, round, and reactive to light. No scleral icterus.  Neck: Neck supple.  Cardiovascular: Normal rate and regular rhythm.   Pulmonary/Chest: Effort normal. No respiratory distress. She has no wheezes. She has no rales.  Abdominal: Soft. Bowel sounds are normal. There is no tenderness.  Musculoskeletal: Normal range of motion. She exhibits no edema.  No swelling in the ankles.  Neurological: She is alert.  Skin: Skin is warm and dry.  Psychiatric: She has a normal mood and affect.  Nursing note and vitals reviewed.    ED Treatments / Results  DIAGNOSTIC STUDIES: Oxygen Saturation is 99% on RA, normal by my interpretation.    COORDINATION OF CARE: 9:40 AM- Pt advised of plan for treatment and pt agrees.  Labs (all labs ordered are listed, but only abnormal results are displayed) Labs Reviewed  BASIC METABOLIC PANEL - Abnormal; Notable for the following:       Result Value   Glucose, Bld 110 (*)    All other components within normal limits  CBC  TROPONIN I  TROPONIN I    EKG  EKG Interpretation  Date/Time:  Friday April 10 2016 09:15:21 EDT Ventricular Rate:  61 PR Interval:    QRS Duration: 97 QT Interval:  427 QTC Calculation: 431 R Axis:   90 Text Interpretation:  Right and left arm  electrode reversal, interpretation assumes no reversal Sinus rhythm Borderline right axis deviation No previous ECGs available Confirmed by Lutie Pickler  MD, Bryann Gentz [phone]) on 04/10/2016 9:27:34 AM       Radiology Dg Chest 2 View  Result Date: 04/10/2016 CLINICAL DATA:  Chest pain off and on for 1 week. History of colon and endometrial cancer. EXAM: CHEST  2 VIEW COMPARISON:  None. FINDINGS: Calcified granuloma in the left upper lobe. Heart is normal size. No confluent opacities or effusions. No acute bony abnormality. IMPRESSION: No active cardiopulmonary disease. Electronically Signed   By: Rolm Baptise M.D.   On: 04/10/2016 09:41    Procedures Procedures (including critical care time)  Medications Ordered in ED Medications - No data to display   Initial Impression / Assessment and Plan / ED Course  I have reviewed the triage vital signs and the nursing notes.  Pertinent labs &  imaging results that were available during my care of the patient were reviewed by me and considered in my medical decision making (see chart for details).  Clinical Course    Patient with chest pain on and off for a week. But never lasting more than 5 or 10 minutes. Usually lasts. Troponins 2 negative chest x-ray without any acute findings EKG without any significant abnormalities. Patient given referral to cardiology for additional follow-up. Patient will return for any new or worse chest pain. Patient will definitely return for any chest pain lasting 15 minutes or longer.  Final Clinical Impressions(s) / ED Diagnoses   Final diagnoses:  Chest pain, unspecified type    New Prescriptions New Prescriptions   No medications on file   I personally performed the services described in this documentation, which was scribed in my presence. The recorded information has been reviewed and is accurate.       Fredia Sorrow, MD 04/10/16 1343

## 2016-04-10 NOTE — ED Triage Notes (Signed)
C/o off and on chest pain times one week.  States she has just not felt good and c/o nausea.

## 2017-12-22 ENCOUNTER — Ambulatory Visit (INDEPENDENT_AMBULATORY_CARE_PROVIDER_SITE_OTHER): Payer: Medicare Other | Admitting: Orthopaedic Surgery

## 2017-12-22 ENCOUNTER — Encounter: Payer: Self-pay | Admitting: Orthopaedic Surgery

## 2017-12-22 VITALS — BP 136/74 | HR 54 | Ht 62.0 in | Wt 167.0 lb

## 2017-12-22 DIAGNOSIS — M25561 Pain in right knee: Secondary | ICD-10-CM

## 2017-12-22 DIAGNOSIS — G8929 Other chronic pain: Secondary | ICD-10-CM | POA: Diagnosis not present

## 2017-12-22 NOTE — Progress Notes (Signed)
Subjective:    Patient ID: Caitlin Mckinney, female    DOB: May 13, 1951, 67 y.o.   MRN: [id number]  HPI She has had recurrent pain of the right knee.  Several years ago she lived in [place], [place] and had seen an orthopedist there.  She had injections in the knee and it helped. She had a MRI then also.  Over the last several months the right knee has begun to hurt more. She has swelling, popping and no giving way.  She has no redness. She has no trauma or numbness.  She has taken Tylenol.  She cannot take NSAIDs she says.    She has seen her family doctor at [facility].  I have copies of the notes which I have reviewed.   Review of Systems  Constitutional: Positive for activity change.  Respiratory: Negative for cough and shortness of breath.   Cardiovascular: Negative for chest pain and leg swelling.  Musculoskeletal: Positive for arthralgias, gait problem and joint swelling.  All other systems reviewed and are negative.      Past Medical History:  Diagnosis Date  . Cancer [facility])    endometrial and colon 2010  . Hypertension     Past Surgical History:  Procedure Laterality Date  . ABDOMINAL HYSTERECTOMY    . APPENDECTOMY    . COLON RESECTION    . COLOSTOMY    . COLOSTOMY REVERSAL    . SHOULDER ARTHROSCOPY    . TUBAL LIGATION      Current Outpatient Medications on File Prior to Visit  Medication Sig Dispense Refill  . atenolol (TENORMIN) 100 MG tablet Take 100 mg by mouth daily.    . cholecalciferol (VITAMIN D) 1000 units tablet Take 1,000 Units by mouth daily.    Marland Kitchen escitalopram (LEXAPRO) 20 MG tablet Take 20 mg by mouth daily.    . fluticasone (FLONASE) 50 MCG/ACT nasal spray Place 1 spray into both nostrils daily as needed for allergies or rhinitis.    Marland Kitchen GUAIFENESIN PO Take 1 tablet by mouth daily as needed.    . Hypromellose (ARTIFICIAL TEARS) 0.4 % SOLN Apply 2 drops to eye 3 (three) times daily.    Marland Kitchen loratadine (CLARITIN) 10 MG tablet Take 10 mg by  mouth daily.    . Magnesium 250 MG TABS Take 250-500 mg by mouth at bedtime.    . methylphenidate (RITALIN) 10 MG tablet Take 10 mg by mouth daily.    Marland Kitchen omeprazole (PRILOSEC) 20 MG capsule Take 1 capsule by mouth daily.    . potassium chloride SA (K-DUR,KLOR-CON) 20 MEQ tablet Take 1 tablet by mouth daily.     No current facility-administered medications on file prior to visit.     Social History   Socioeconomic History  . Marital status: Married    Spouse name: Not on file  . Number of children: Not on file  . Years of education: Not on file  . Highest education level: Not on file  Occupational History  . Not on file  Social Needs  . Financial resource strain: Not on file  . Food insecurity:    Worry: Not on file    Inability: Not on file  . Transportation needs:    Medical: Not on file    Non-medical: Not on file  Tobacco Use  . Smoking status: Former Research scientist (life sciences)  . Smokeless tobacco: Never Used  Substance and Sexual Activity  . Alcohol use: No  . Drug use: No  . Sexual activity:  Not on file  Lifestyle  . Physical activity:    Days per week: Not on file    Minutes per session: Not on file  . Stress: Not on file  Relationships  . Social connections:    Talks on phone: Not on file    Gets together: Not on file    Attends religious service: Not on file    Active member of club or organization: Not on file    Attends meetings of clubs or organizations: Not on file    Relationship status: Not on file  . Intimate partner violence:    Fear of current or ex partner: Not on file    Emotionally abused: Not on file    Physically abused: Not on file    Forced sexual activity: Not on file  Other Topics Concern  . Not on file  Social History Narrative  . Not on file    History reviewed. No pertinent family history.  BP 136/74   Pulse (!) 54   Ht 5\' 2"  (1.575 m)   Wt 167 lb (75.8 kg)   BMI 30.54 kg/m   Body mass index is 30.54 kg/m.  Objective:   Physical Exam    Constitutional: She is oriented to person, place, and time. She appears well-developed and well-nourished.  HENT:  Head: Normocephalic and atraumatic.  Eyes: Pupils are equal, round, and reactive to light. Conjunctivae and EOM are normal.  Neck: Normal range of motion. Neck supple.  Cardiovascular: Normal rate, regular rhythm and intact distal pulses.  Pulmonary/Chest: Effort normal.  Abdominal: Soft.  Musculoskeletal:       Legs: Neurological: She is alert and oriented to person, place, and time. She has normal reflexes. She displays normal reflexes. No cranial nerve deficit. She exhibits normal muscle tone. Coordination normal.  Skin: Skin is warm and dry.  Psychiatric: She has a normal mood and affect. Her behavior is normal. Judgment and thought content normal.    I have reviewed the x-rays from [facility].  She has degenerative changes of the right knee moderate with medial narrowing.      Assessment & Plan:   Encounter Diagnosis  Name Primary?  . Chronic pain of right knee Yes   PROCEDURE NOTE:  The patient requests injections of the right knee , verbal consent was obtained.  The right knee was prepped appropriately after time out was performed.   Sterile technique was observed and injection of 1 cc of Depo-Medrol 40 mg with several cc's of plain xylocaine. Anesthesia was provided by ethyl chloride and a 20-gauge needle was used to inject the knee area. The injection was tolerated well.  A band aid dressing was applied.  The patient was advised to apply ice later today and tomorrow to the injection sight as needed.  I will see her back in two weeks and see how she responded to the injection.  She may need MRI.  Call if any problem.  Precautions discussed.   Electronically Signed Sanjuana Kava, MD 6/26/20192:56 PM

## 2018-01-05 ENCOUNTER — Ambulatory Visit (INDEPENDENT_AMBULATORY_CARE_PROVIDER_SITE_OTHER): Payer: Medicare Other | Admitting: Orthopaedic Surgery

## 2018-01-05 ENCOUNTER — Encounter: Payer: Self-pay | Admitting: Orthopaedic Surgery

## 2018-01-05 VITALS — BP 127/65 | HR 54 | Temp 97.7°F | Ht 60.0 in | Wt 170.0 lb

## 2018-01-05 DIAGNOSIS — G8929 Other chronic pain: Secondary | ICD-10-CM

## 2018-01-05 DIAGNOSIS — M25561 Pain in right knee: Secondary | ICD-10-CM

## 2018-01-05 NOTE — Progress Notes (Signed)
Patient Caitlin Mckinney, female DOB:[birth year], 67 y.o. FBP:[id number]  CC:  My knee still hurts.  HPI  Caitlin Mckinney is a 67 y.o. female who has continued pain of the right knee.  The injection helped last time but she says her knee still hurts and is not "right".  She has tendency for it to give way now.  She has no locking, no redness.  She has swelling and popping.   Body mass index is 33.2 kg/m.  ROS  Review of Systems  Constitutional: Positive for activity change.  Respiratory: Negative for cough and shortness of breath.   Cardiovascular: Negative for chest pain and leg swelling.  Musculoskeletal: Positive for arthralgias, gait problem and joint swelling.  All other systems reviewed and are negative.   All other systems reviewed and are negative.  Past Medical History:  Diagnosis Date  . Cancer [facility])    endometrial and colon 2010  . Hypertension     Past Surgical History:  Procedure Laterality Date  . ABDOMINAL HYSTERECTOMY    . APPENDECTOMY    . COLON RESECTION    . COLOSTOMY    . COLOSTOMY REVERSAL    . SHOULDER ARTHROSCOPY    . TUBAL LIGATION      History reviewed. No pertinent family history.  Social History Social History   Tobacco Use  . Smoking status: Former Research scientist (life sciences)  . Smokeless tobacco: Never Used  Substance Use Topics  . Alcohol use: No  . Drug use: No    Allergies  Allergen Reactions  . Contrast Media [Iodinated Diagnostic Agents] Shortness Of Breath  . Other     States her blood pressure medicine made her cough   . Allevess [Capsaicin-Menthol] Rash  . Erythromycin Rash  . Naproxen Rash    Current Outpatient Medications  Medication Sig Dispense Refill  . atenolol (TENORMIN) 100 MG tablet Take 100 mg by mouth daily.    . cholecalciferol (VITAMIN D) 1000 units tablet Take 1,000 Units by mouth daily.    Marland Kitchen escitalopram (LEXAPRO) 20 MG tablet Take 20 mg by mouth daily.    . fluticasone (FLONASE) 50 MCG/ACT nasal spray Place 1  spray into both nostrils daily as needed for allergies or rhinitis.    Marland Kitchen GUAIFENESIN PO Take 1 tablet by mouth daily as needed.    . Hypromellose (ARTIFICIAL TEARS) 0.4 % SOLN Apply 2 drops to eye 3 (three) times daily.    Marland Kitchen loratadine (CLARITIN) 10 MG tablet Take 10 mg by mouth daily.    . Magnesium 250 MG TABS Take 250-500 mg by mouth at bedtime.    . methylphenidate (RITALIN) 10 MG tablet Take 10 mg by mouth daily.    Marland Kitchen omeprazole (PRILOSEC) 20 MG capsule Take 1 capsule by mouth daily.    . potassium chloride SA (K-DUR,KLOR-CON) 20 MEQ tablet Take 1 tablet by mouth daily.     No current facility-administered medications for this visit.      Physical Exam  Blood pressure 127/65, pulse (!) 54, temperature 97.7 F (36.5 C), height 5' (1.524 m), weight 170 lb (77.1 kg).  Constitutional: overall normal hygiene, normal nutrition, well developed, normal grooming, normal body habitus. Assistive device:none  Musculoskeletal: gait and station Limp right, muscle tone and strength are normal, no tremors or atrophy is present.  .  Neurological: coordination overall normal.  Deep tendon reflex/nerve stretch intact.  Sensation normal.  Cranial nerves II-XII intact.   Skin:   Normal overall no scars, lesions, ulcers or rashes.  No psoriasis.  Psychiatric: Alert and oriented x 3.  Recent memory intact, remote memory unclear.  Normal mood and affect. Well groomed.  Good eye contact.  Cardiovascular: overall no swelling, no varicosities, no edema bilaterally, normal temperatures of the legs and arms, no clubbing, cyanosis and good capillary refill.  Lymphatic: palpation is normal.  Right knee has effusion, 1+, crepitus, ROM 0 to 110, positive medial McMurray, limp to the right.  All other systems reviewed and are negative   The patient has been educated about the nature of the problem(s) and counseled on treatment options.  The patient appeared to understand what I have discussed and is in  agreement with it.  No diagnosis found.  PLAN Call if any problems.  Precautions discussed.  Continue current medications.   Return to clinic get MRI of the right knee   Electronically Signed Sanjuana Kava, MD 7/10/20199:58 AM

## 2018-01-05 NOTE — Patient Instructions (Signed)
..  Your MRI has been ordered.  We will contact your insurance company for approval. After the authorization is received the MRI and a return appointment will be scheduled with you by phone.  If you do not hear from us within 5 business days please call [phone] and ask for the pre-authorization representative in our office.  

## 2018-01-12 ENCOUNTER — Ambulatory Visit (HOSPITAL_COMMUNITY)
Admission: RE | Admit: 2018-01-12 | Discharge: 2018-01-12 | Disposition: A | Discharge status: Home / Self Care | Payer: Medicare Other | Source: Ambulatory Visit | Attending: Orthopaedic Surgery | Admitting: Orthopaedic Surgery

## 2018-01-12 DIAGNOSIS — G8929 Other chronic pain: Secondary | ICD-10-CM | POA: Diagnosis present

## 2018-01-12 DIAGNOSIS — M25561 Pain in right knee: Secondary | ICD-10-CM | POA: Diagnosis present

## 2018-01-12 DIAGNOSIS — M238X1 Other internal derangements of right knee: Secondary | ICD-10-CM | POA: Diagnosis not present

## 2018-01-12 DIAGNOSIS — M25461 Effusion, right knee: Secondary | ICD-10-CM | POA: Insufficient documentation

## 2018-01-18 ENCOUNTER — Ambulatory Visit: Payer: Medicare Other | Admitting: Orthopaedic Surgery

## 2018-01-25 ENCOUNTER — Ambulatory Visit (INDEPENDENT_AMBULATORY_CARE_PROVIDER_SITE_OTHER): Payer: Medicare Other | Admitting: Orthopaedic Surgery

## 2018-01-25 ENCOUNTER — Encounter: Payer: Self-pay | Admitting: Orthopaedic Surgery

## 2018-01-25 VITALS — BP 130/73 | HR 52 | Ht 60.0 in | Wt 167.0 lb

## 2018-01-25 DIAGNOSIS — G8929 Other chronic pain: Secondary | ICD-10-CM | POA: Diagnosis not present

## 2018-01-25 DIAGNOSIS — M25561 Pain in right knee: Secondary | ICD-10-CM | POA: Diagnosis not present

## 2018-01-25 NOTE — Progress Notes (Signed)
Patient Caitlin Mckinney, female DOB:22-Dec-1950, 67 y.o. URK:[id number]  Chief Complaint  Patient presents with  . Follow-up    R Knee MRI    HPI  Caitlin Mckinney is a 67 y.o. female who has pain of the right knee.  She had a MRI done and it showed: IMPRESSION: 1. Severely degenerated and torn midbody region of the medial meniscus. 2. Tricompartmental degenerative changes most significant in the medial compartment. 3. Advanced mucoid degeneration of the ACL. 4. Small joint effusion.  I have explained the findings to her.  I have recommended consideration of arthroscopy.  I will have her seen at [place] as Dr. Aline Brochure is out of town.  She wants to proceed for possible surgery asap.  I will have them evaluate her there for possible arthroscopy.   Body mass index is 32.61 kg/m.  ROS  Review of Systems  Constitutional: Positive for activity change.  Respiratory: Negative for cough and shortness of breath.   Cardiovascular: Negative for chest pain and leg swelling.  Musculoskeletal: Positive for arthralgias, gait problem and joint swelling.  All other systems reviewed and are negative.   All other systems reviewed and are negative.  Past Medical History:  Diagnosis Date  . Cancer [facility])    endometrial and colon 2010  . Hypertension     Past Surgical History:  Procedure Laterality Date  . ABDOMINAL HYSTERECTOMY    . APPENDECTOMY    . COLON RESECTION    . COLOSTOMY    . COLOSTOMY REVERSAL    . SHOULDER ARTHROSCOPY    . TUBAL LIGATION      No family history on file.  Social History Social History   Tobacco Use  . Smoking status: Former Research scientist (life sciences)  . Smokeless tobacco: Never Used  Substance Use Topics  . Alcohol use: No  . Drug use: No    Allergies  Allergen Reactions  . Contrast Media [Iodinated Diagnostic Agents] Shortness Of Breath  . Other     States her blood pressure medicine made her cough   . Allevess [Capsaicin-Menthol] Rash  .  Erythromycin Rash  . Naproxen Rash    Current Outpatient Medications  Medication Sig Dispense Refill  . atenolol (TENORMIN) 100 MG tablet Take 100 mg by mouth daily.    . cholecalciferol (VITAMIN D) 1000 units tablet Take 1,000 Units by mouth daily.    Marland Kitchen escitalopram (LEXAPRO) 20 MG tablet Take 20 mg by mouth daily.    . fluticasone (FLONASE) 50 MCG/ACT nasal spray Place 1 spray into both nostrils daily as needed for allergies or rhinitis.    Marland Kitchen GUAIFENESIN PO Take 1 tablet by mouth daily as needed.    . Hypromellose (ARTIFICIAL TEARS) 0.4 % SOLN Apply 2 drops to eye 3 (three) times daily.    Marland Kitchen loratadine (CLARITIN) 10 MG tablet Take 10 mg by mouth daily.    . Magnesium 250 MG TABS Take 250-500 mg by mouth at bedtime.    . methylphenidate (RITALIN) 10 MG tablet Take 10 mg by mouth daily.    Marland Kitchen omeprazole (PRILOSEC) 20 MG capsule Take 1 capsule by mouth daily.    . potassium chloride SA (K-DUR,KLOR-CON) 20 MEQ tablet Take 1 tablet by mouth daily.     No current facility-administered medications for this visit.      Physical Exam  Blood pressure 130/73, pulse (!) 52, height 5' (1.524 m), weight 167 lb (75.8 kg).  Constitutional: overall normal hygiene, normal nutrition, well developed, normal grooming, normal  body habitus. Assistive device:none  Musculoskeletal: gait and station Limp right, muscle tone and strength are normal, no tremors or atrophy is present.  .  Neurological: coordination overall normal.  Deep tendon reflex/nerve stretch intact.  Sensation normal.  Cranial nerves II-XII intact.   Skin:   Normal overall no scars, lesions, ulcers or rashes. No psoriasis.  Psychiatric: Alert and oriented x 3.  Recent memory intact, remote memory unclear.  Normal mood and affect. Well groomed.  Good eye contact.  Cardiovascular: overall no swelling, no varicosities, no edema bilaterally, normal temperatures of the legs and arms, no clubbing, cyanosis and good capillary  refill.  Lymphatic: palpation is normal.  Right knee with ROM 0 to 110, crepitus, pain medial joint line, limp to the right, NV intact, positive medial [place].  All other systems reviewed and are negative   The patient has been educated about the nature of the problem(s) and counseled on treatment options.  The patient appeared to understand what I have discussed and is in agreement with it.  Encounter Diagnosis  Name Primary?  . Chronic pain of right knee Yes    PLAN Call if any problems.  Precautions discussed.  Continue current medications.   Return to clinic to [place] for evaluation for possible arthroscopy of the right knee.   Electronically Signed Sanjuana Kava, MD 7/30/201910:11 AM

## 2018-01-25 NOTE — Patient Instructions (Signed)
You will be receiving a call from [employer]. If you do not receive a call in the next week please call.  Dr. Lind Guest.[facility]  [address] [place], [place] [address]  [phone]

## 2018-02-16 ENCOUNTER — Encounter (INDEPENDENT_AMBULATORY_CARE_PROVIDER_SITE_OTHER): Payer: Self-pay | Admitting: Orthopaedic Surgery

## 2018-02-16 ENCOUNTER — Telehealth (INDEPENDENT_AMBULATORY_CARE_PROVIDER_SITE_OTHER): Payer: Self-pay

## 2018-02-16 ENCOUNTER — Ambulatory Visit (INDEPENDENT_AMBULATORY_CARE_PROVIDER_SITE_OTHER): Payer: Medicare Other

## 2018-02-16 ENCOUNTER — Ambulatory Visit (INDEPENDENT_AMBULATORY_CARE_PROVIDER_SITE_OTHER): Payer: Medicare Other | Admitting: Orthopaedic Surgery

## 2018-02-16 DIAGNOSIS — M25561 Pain in right knee: Secondary | ICD-10-CM | POA: Diagnosis not present

## 2018-02-16 DIAGNOSIS — G8929 Other chronic pain: Secondary | ICD-10-CM

## 2018-02-16 DIAGNOSIS — M1711 Unilateral primary osteoarthritis, right knee: Secondary | ICD-10-CM

## 2018-02-16 MED ORDER — METHYLPREDNISOLONE ACETATE 40 MG/ML IJ SUSP
40.0000 mg | INTRAMUSCULAR | Status: AC | PRN
Start: 2018-02-16 — End: 2018-02-16
  Administered 2018-02-16: 40 mg via INTRA_ARTICULAR

## 2018-02-16 MED ORDER — LIDOCAINE HCL 1 % IJ SOLN
3.0000 mL | INTRAMUSCULAR | Status: AC | PRN
  Administered 2018-02-16: 3 mL

## 2018-02-16 NOTE — Progress Notes (Signed)
Office Visit Note   Patient: Caitlin Mckinney           Date of Birth: [birth year]           MRN: [id number] Visit Date: 02/16/2018              Requested by: Abran Richard, MD [address] [place], [place] [address] PCP: Abran Richard, MD   Assessment & Plan: Visit Diagnoses:  1. Chronic pain of right knee   2. Unilateral primary osteoarthritis, right knee     Plan: She definitely wants to try to stay conservative and she has not had a steroid injection a long period time.  I talked about the risk minutes of these injections and then placed one her right knee without difficulty at all.  She is a perfect candidate also try hyaluronic acid on.  She is interested in this as well.  We will order this for her and see her back in 4 weeks to place hyaluronic acid into the right knee.  All questions concerns were answered and addressed.  Follow-Up Instructions: Return in about 4 weeks (around 03/16/2018).   Orders:  Orders Placed This Encounter  Procedures  . Large Joint Inj  . XR Knee 1-2 Views Right   No orders of the defined types were placed in this encounter.     Procedures: Large Joint Inj on 02/16/2018 11:41 AM Indications: diagnostic evaluation and pain Details: 22 G 1.5 in needle, superolateral approach  Arthrogram: No  Medications: 3 mL lidocaine 1 %; 40 mg methylPREDNISolone acetate 40 MG/ML Outcome: tolerated well, no immediate complications Procedure, treatment alternatives, risks and benefits explained, specific risks discussed. Consent was given by the patient. Immediately prior to procedure a time out was called to verify the correct patient, procedure, equipment, support staff and site/side marked as required. Patient was prepped and draped in the usual sterile fashion.       Clinical Data: No additional findings.   Subjective: Chief Complaint  Patient presents with  . Right Knee - Pain  Patient is a very pleasant 67 year old female who was sent  down from Dr. Luna Glasgow at [place] and rocking him [place] to evaluate and treat worsening right knee pain and arthritis.  She actually has an MRI that accompanies her for obtaining plain films today.  She did report a remote history of steroid injections in her right knee that did help temporize things.  She is 67 years old.  She is to be treated in [facility].  She is now taking care of grandkids in this area and has putting strain on her knee and hurts quite a bit.  She has not had any recent injections at all.  She is not interested in any surgery thus far as since she is recovering and surviving from colon cancer surgery.  She is hoping to have interventions that can help temporize her symptoms.  HPI  Review of Systems She currently denies any headache, chest pain, shortness of breath, fever, chills, nausea, vomiting thanks  Objective: Vital Signs: There were no vitals taken for this visit.  Physical Exam She is alert and oriented x3 and in no acute distress Ortho Exam Examination of both knees is obtained.  Her left knee is normal and has normal alignment.  Her right knee has obvious varus malalignment.  It is easily correctable.  She has significant patellofemoral crepitation with full range of motion the right knee.  Feels loosely stable but she  has quite significant medial joint line tenderness. Specialty Comments:  No specialty comments available.  Imaging: Xr Knee 1-2 Views Right  Result Date: 02/16/2018 2 views of the right knee show tricompartment arthritis with varus malalignment.  There is significant medial joint space narrowing with sclerotic changes in the medial compartment.  There are periarticular osteophytes throughout the knee.  There is significant patellofemoral arthritic changes.  She does have an MRI of her right knee that is significantly reviewed.  This shows large areas of full-thickness cartilage loss in the medial femoral condyle and  some of the patellofemoral joint.  There is a macerated degenerative posterior medial meniscal tear.  PMFS History: Patient Active Problem List   Diagnosis Date Noted  . Unilateral primary osteoarthritis, right knee 02/16/2018  . Pain in joint, shoulder region 03/10/2011  . Complete rupture of rotator cuff 03/10/2011  . Muscle weakness (generalized) 03/10/2011   Past Medical History:  Diagnosis Date  . Cancer [facility])    endometrial and colon 2010  . Hypertension     History reviewed. No pertinent family history.  Past Surgical History:  Procedure Laterality Date  . ABDOMINAL HYSTERECTOMY    . APPENDECTOMY    . COLON RESECTION    . COLOSTOMY    . COLOSTOMY REVERSAL    . SHOULDER ARTHROSCOPY    . TUBAL LIGATION     Social History   Occupational History  . Not on file  Tobacco Use  . Smoking status: Former Research scientist (life sciences)  . Smokeless tobacco: Never Used  Substance and Sexual Activity  . Alcohol use: No  . Drug use: No  . Sexual activity: Not on file

## 2018-02-16 NOTE — Telephone Encounter (Signed)
Right knee gel injection  

## 2018-02-21 ENCOUNTER — Telehealth (INDEPENDENT_AMBULATORY_CARE_PROVIDER_SITE_OTHER): Payer: Self-pay

## 2018-02-21 NOTE — Telephone Encounter (Signed)
Submitted VOB for Monovisc, right knee. 

## 2018-02-21 NOTE — Telephone Encounter (Signed)
Noted  

## 2018-03-01 ENCOUNTER — Telehealth (INDEPENDENT_AMBULATORY_CARE_PROVIDER_SITE_OTHER): Payer: Self-pay

## 2018-03-01 NOTE — Telephone Encounter (Signed)
Patient is approved for Monovisc, right knee. [place] Patient will be responsible for 20% OOP. No Co-pay No PA required Appt.scheduled 03/16/2018

## 2018-03-16 ENCOUNTER — Ambulatory Visit (INDEPENDENT_AMBULATORY_CARE_PROVIDER_SITE_OTHER): Payer: Medicare Other | Admitting: Orthopaedic Surgery

## 2018-03-16 ENCOUNTER — Encounter (INDEPENDENT_AMBULATORY_CARE_PROVIDER_SITE_OTHER): Payer: Self-pay | Admitting: Orthopaedic Surgery

## 2018-03-16 DIAGNOSIS — M1712 Unilateral primary osteoarthritis, left knee: Secondary | ICD-10-CM

## 2018-03-16 MED ORDER — HYALURONAN 88 MG/4ML IX SOSY
88.0000 mg | PREFILLED_SYRINGE | INTRA_ARTICULAR | Status: AC | PRN
  Administered 2018-03-16: 88 mg via INTRA_ARTICULAR

## 2018-03-16 NOTE — Progress Notes (Signed)
   Procedure Note  Patient: Caitlin Mckinney             Date of Birth: 13-Jun-1951           MRN: [id number]             Visit Date: 03/16/2018  HPI: Ms. Crail returns today for Monovisc injection of her right knee.  She has known tricompartmental arthritis of her right knee varus malalignment.  She is trying to avoid surgery if possible.  She does state that the cortisone injection has helped with her knee pain.  Physical exam: Right knee no effusion abnormal warmth erythema.  Procedures: Visit Diagnoses: Unilateral primary osteoarthritis, left knee  Large Joint Inj on 03/16/2018 9:20 AM Details: 22 G 1.5 in needle, superolateral approach Medications: 88 mg Hyaluronan 88 MG/4ML Consent was given by the patient. Patient was prepped and draped in the usual sterile fashion.    Plan: Discussed quad strengthening exercises with her.  She will follow-up with [place] on as-needed basis.  Did discuss with her that if she needed another cortisone injection would wait 3 months between injections.  Far as the Monovisc injection she can have these every 6 months.  Questions encouraged and answered at length.

## 2018-07-19 ENCOUNTER — Other Ambulatory Visit: Payer: Self-pay | Admitting: Internal Medicine

## 2018-07-19 DIAGNOSIS — Z1231 Encounter for screening mammogram for malignant neoplasm of breast: Secondary | ICD-10-CM

## 2018-08-17 ENCOUNTER — Encounter: Payer: Self-pay | Admitting: Radiology

## 2018-08-17 ENCOUNTER — Ambulatory Visit
Admission: RE | Admit: 2018-08-17 | Discharge: 2018-08-17 | Disposition: A | Discharge status: Home / Self Care | Payer: Medicare Other | Source: Ambulatory Visit | Attending: Internal Medicine | Admitting: Internal Medicine

## 2018-08-17 DIAGNOSIS — Z1231 Encounter for screening mammogram for malignant neoplasm of breast: Secondary | ICD-10-CM | POA: Insufficient documentation

## 2018-10-23 ENCOUNTER — Encounter: Payer: Self-pay | Admitting: Orthopaedic Surgery

## 2019-07-24 ENCOUNTER — Other Ambulatory Visit: Payer: Self-pay | Admitting: Internal Medicine

## 2019-07-24 DIAGNOSIS — Z13820 Encounter for screening for osteoporosis: Secondary | ICD-10-CM

## 2019-08-15 IMAGING — MG DIGITAL SCREENING BILATERAL MAMMOGRAM WITH TOMO AND CAD
8 series · 8 of 24 positions shown · non-contrast
Comparison: Previous exam(s).

CLINICAL DATA: Screening.

EXAM:
DIGITAL SCREENING BILATERAL MAMMOGRAM WITH TOMO AND CAD

[R MLO synth-2D]
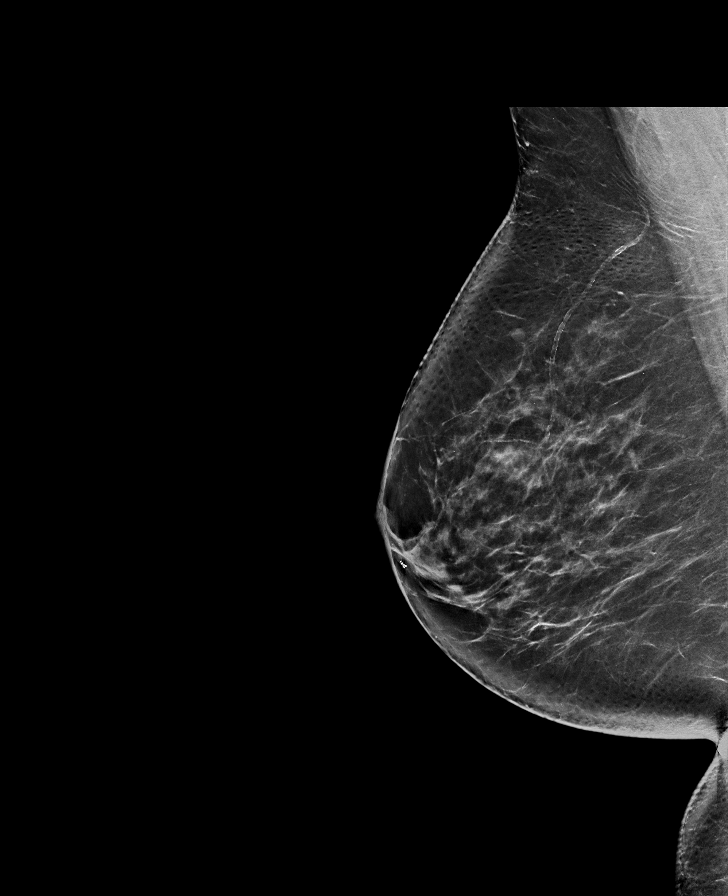

[L CC synth-2D]
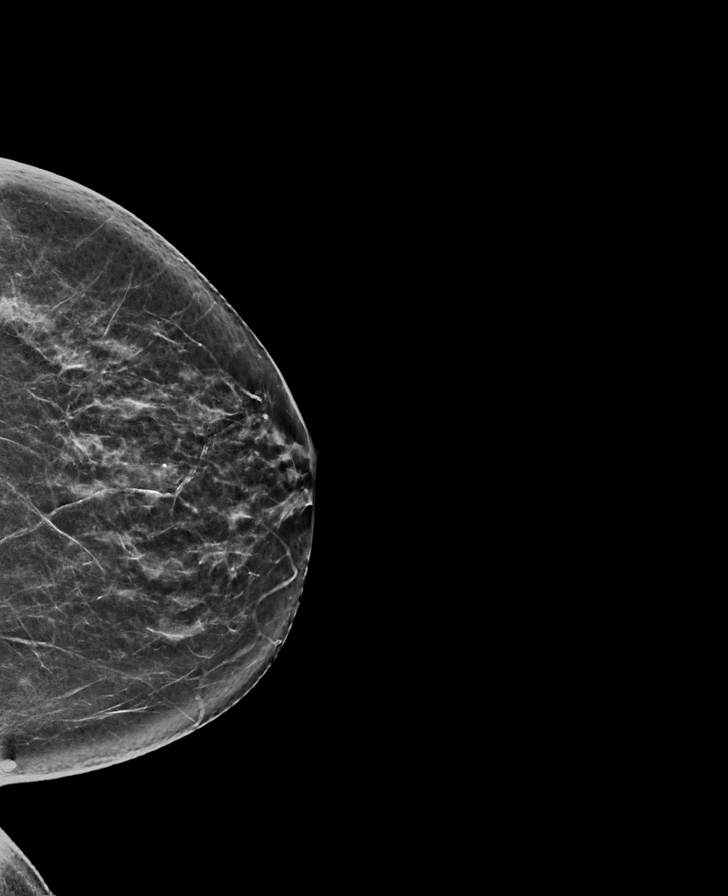

[L MLO synth-2D]
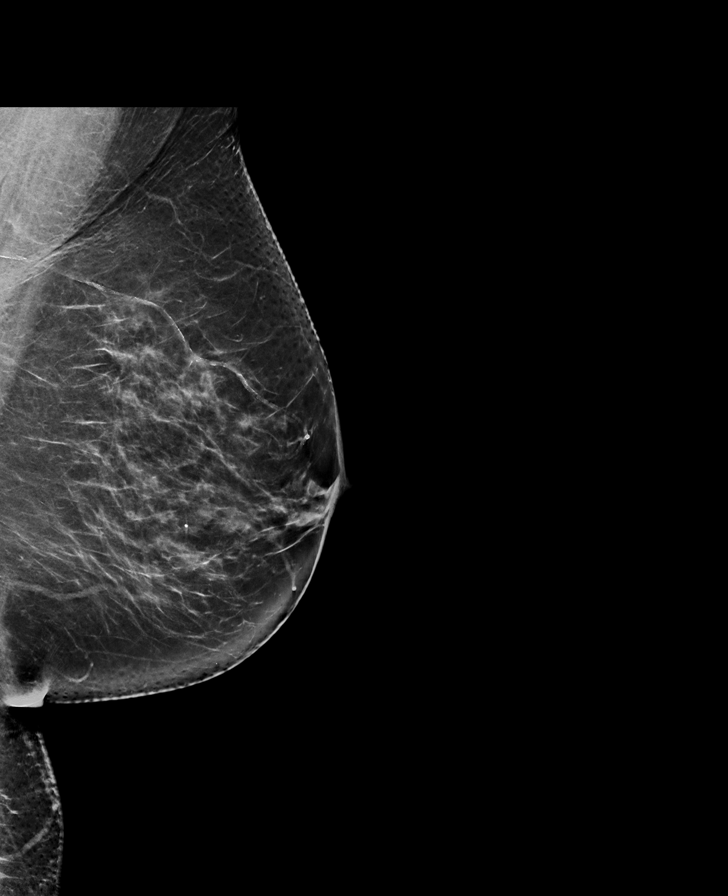

[R CC synth-2D]
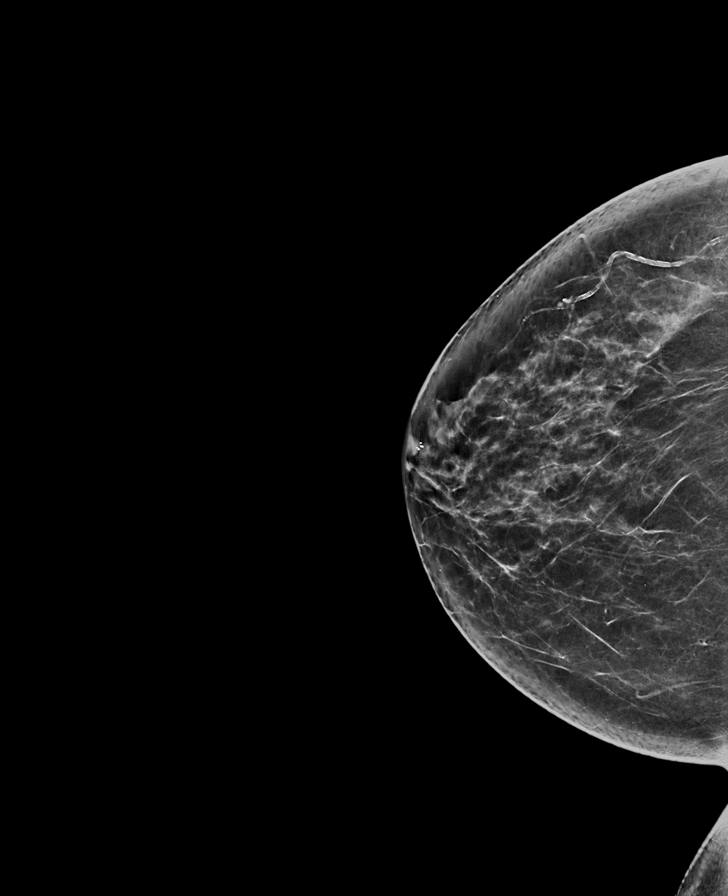

[R MLO tomo · tomo slice 39/78.0]
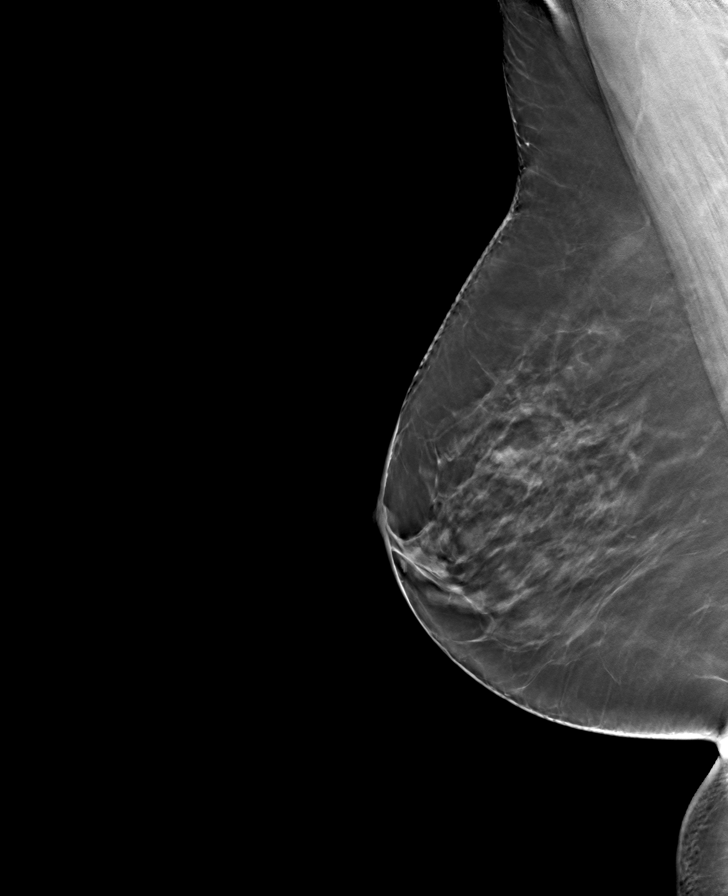

[L MLO tomo · tomo slice 41/81.0]
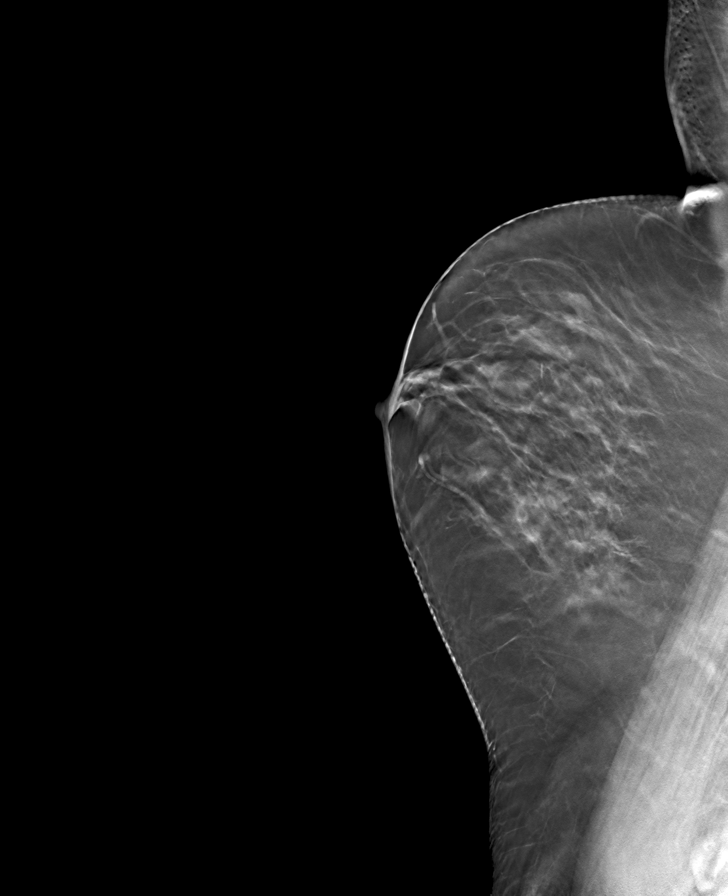

[L CC tomo · tomo slice 35/70.0]
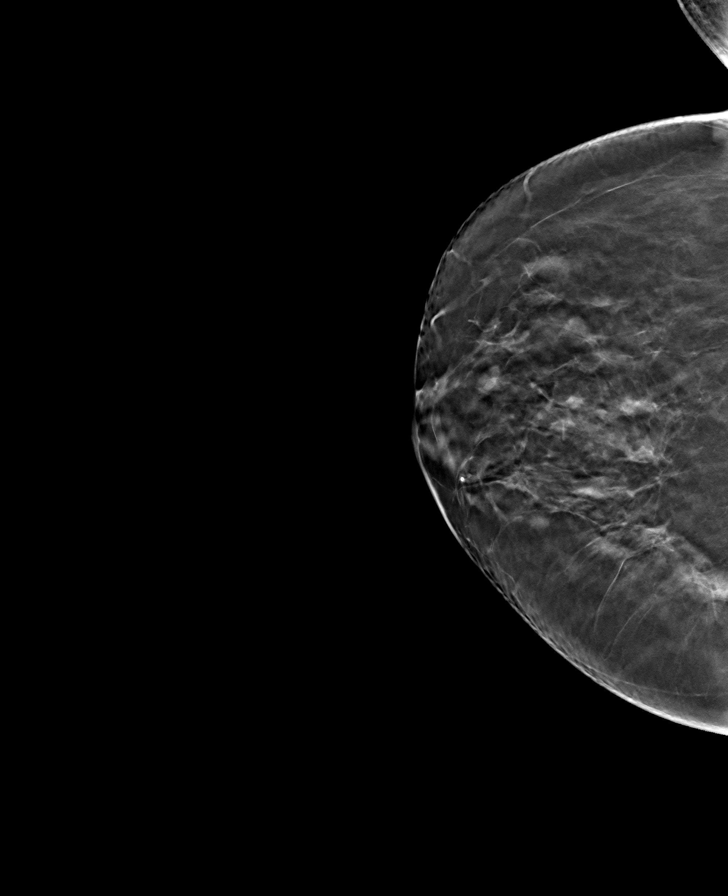

[R CC tomo · tomo slice 37/73.0]
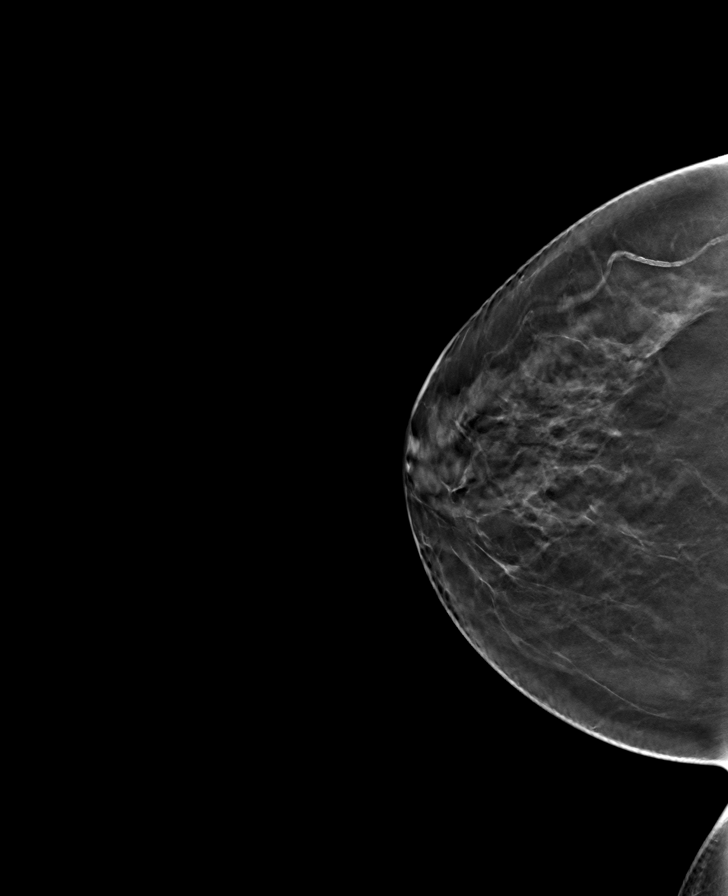

[8 of 24 positions shown; findings below may reference images not displayed]

ACR Breast Density Category c: The breast tissue is heterogeneously
dense, which may obscure small masses.
FINDINGS: There are no findings suspicious for malignancy. Images were
processed with CAD.
IMPRESSION: No mammographic evidence of malignancy. A result letter of this
screening mammogram will be mailed directly to the patient.

RECOMMENDATION:
Screening mammogram in one year. (Code:FT-U-LHB)

BI-RADS CATEGORY  1: Negative.

## 2019-10-06 ENCOUNTER — Other Ambulatory Visit: Payer: Self-pay

## 2019-10-06 ENCOUNTER — Emergency Department (HOSPITAL_COMMUNITY): Payer: Medicare Other

## 2019-10-06 ENCOUNTER — Encounter (HOSPITAL_COMMUNITY): Payer: Self-pay

## 2019-10-06 ENCOUNTER — Emergency Department (HOSPITAL_COMMUNITY)
Admission: EM | Admit: 2019-10-06 | Discharge: 2019-10-06 | Disposition: A | Discharge status: Home / Self Care | Payer: Medicare Other | Attending: Emergency Medicine | Admitting: Emergency Medicine

## 2019-10-06 DIAGNOSIS — K625 Hemorrhage of anus and rectum: Secondary | ICD-10-CM | POA: Insufficient documentation

## 2019-10-06 DIAGNOSIS — Z87891 Personal history of nicotine dependence: Secondary | ICD-10-CM | POA: Diagnosis not present

## 2019-10-06 DIAGNOSIS — Z79899 Other long term (current) drug therapy: Secondary | ICD-10-CM | POA: Insufficient documentation

## 2019-10-06 DIAGNOSIS — Z8542 Personal history of malignant neoplasm of other parts of uterus: Secondary | ICD-10-CM | POA: Insufficient documentation

## 2019-10-06 DIAGNOSIS — Z85038 Personal history of other malignant neoplasm of large intestine: Secondary | ICD-10-CM | POA: Insufficient documentation

## 2019-10-06 DIAGNOSIS — I10 Essential (primary) hypertension: Secondary | ICD-10-CM | POA: Diagnosis not present

## 2019-10-06 LAB — CBC
HCT: 44.4 % (ref 36.0–46.0)
Hemoglobin: 14.6 g/dL (ref 12.0–15.0)
MCH: 31.3 pg (ref 26.0–34.0)
MCHC: 32.9 g/dL (ref 30.0–36.0)
MCV: 95.1 fL (ref 80.0–100.0)
Platelets: 230 10*3/uL (ref 150–400)
RBC: 4.67 MIL/uL (ref 3.87–5.11)
RDW: 12.4 % (ref 11.5–15.5)
WBC: 5.6 10*3/uL (ref 4.0–10.5)
nRBC: 0 % (ref 0.0–0.2)

## 2019-10-06 LAB — COMPREHENSIVE METABOLIC PANEL
ALT: 30 U/L (ref 0–44)
AST: 37 U/L (ref 15–41)
Albumin: 4.6 g/dL (ref 3.5–5.0)
Alkaline Phosphatase: 96 U/L (ref 38–126)
Anion gap: 8 (ref 5–15)
BUN: 15 mg/dL (ref 8–23)
CO2: 28 mmol/L (ref 22–32)
Calcium: 9.4 mg/dL (ref 8.9–10.3)
Chloride: 102 mmol/L (ref 98–111)
Creatinine, Ser: 0.58 mg/dL (ref 0.44–1.00)
GFR calc Af Amer: >60 mL/min (ref >60)
GFR calc non Af Amer: >60 mL/min (ref >60)
Glucose, Bld: 119 mg/dL — ABNORMAL HIGH (ref 70–99)
Potassium: 4.7 mmol/L (ref 3.5–5.1)
Sodium: 138 mmol/L (ref 135–145)
Total Bilirubin: 2.1 mg/dL — ABNORMAL HIGH (ref 0.3–1.2)
Total Protein: 8.1 g/dL (ref 6.5–8.1)

## 2019-10-06 LAB — TYPE AND SCREEN
ABO/RH(D): O POS
Antibody Screen: NEGATIVE

## 2019-10-06 LAB — POC OCCULT BLOOD, ED: Fecal Occult Bld: POSITIVE — AB

## 2019-10-06 MED ORDER — IOHEXOL 350 MG/ML SOLN: 80.0000 mL | Freq: Once | INTRAVENOUS | Status: DC | PRN

## 2019-10-06 MED ORDER — IOHEXOL 300 MG/ML  SOLN
80.0000 mL | Freq: Once | INTRAMUSCULAR | Status: AC | PRN
  Administered 2019-10-06: 80 mL via INTRAVENOUS

## 2019-10-06 MED ORDER — METHYLPREDNISOLONE SODIUM SUCC 125 MG IJ SOLR: 125.0000 mg | Freq: Once | INTRAMUSCULAR | Status: DC

## 2019-10-06 MED ORDER — DIPHENHYDRAMINE HCL 50 MG/ML IJ SOLN
50.0000 mg | Freq: Once | INTRAMUSCULAR | Status: AC
  Administered 2019-10-06: 12:00:00 50 mg via INTRAVENOUS
  Filled 2019-10-06: qty 1

## 2019-10-06 MED ORDER — HYDROCORTISONE NA SUCCINATE PF 250 MG IJ SOLR
200.0000 mg | Freq: Once | INTRAMUSCULAR | Status: AC
  Administered 2019-10-06: 200 mg via INTRAVENOUS
  Filled 2019-10-06: qty 200

## 2019-10-06 MED ORDER — SODIUM CHLORIDE 0.9 % IV BOLUS
1000.0000 mL | Freq: Once | INTRAVENOUS | Status: AC
  Administered 2019-10-06: 1000 mL via INTRAVENOUS

## 2019-10-06 NOTE — Discharge Instructions (Addendum)
Follow-up with Dr. Laural Golden gastroenterologist next week.  Or follow-up with your own gastroenterologist.  Make sure you return if you start having severe bleeding.

## 2019-10-06 NOTE — ED Triage Notes (Signed)
Pt reports rectal bleeding for at least 2 days. Bright red blood with clots. Reports rectal pain

## 2019-10-06 NOTE — ED Provider Notes (Signed)
[facility] EMERGENCY DEPARTMENT Provider Note   CSN: [id number] Arrival date & time: 10/06/19  4034     History Chief Complaint  Patient presents with  . Rectal Pain    Caitlin Mckinney is a 69 y.o. female.  Complete  Patient states she had seen today.  Patient has a history of colon cancer.  She is not having any pain.  The history is provided by the patient and medical records. No language interpreter was used.  Rectal Bleeding Quality:  Bright red Amount:  Moderate Timing:  Intermittent Chronicity:  New Context: not anal fissures   Similar prior episodes: no   Relieved by:  Nothing Worsened by:  Nothing Associated symptoms: no abdominal pain   Risk factors: no anticoagulant use        Past Medical History:  Diagnosis Date  . Cancer [facility])    endometrial and colon 2010  . Hypertension     Patient Active Problem List   Diagnosis Date Noted  . Unilateral primary osteoarthritis, left knee 03/16/2018  . Unilateral primary osteoarthritis, right knee 02/16/2018  . Pain in joint, shoulder region 03/10/2011  . Complete rupture of rotator cuff 03/10/2011  . Muscle weakness (generalized) 03/10/2011    Past Surgical History:  Procedure Laterality Date  . ABDOMINAL HYSTERECTOMY    . APPENDECTOMY    . COLON RESECTION    . COLOSTOMY    . COLOSTOMY REVERSAL    . SHOULDER ARTHROSCOPY    . TUBAL LIGATION       OB History   No obstetric history on file.     No family history on file.  Social History   Tobacco Use  . Smoking status: Former Research scientist (life sciences)  . Smokeless tobacco: Never Used  Substance Use Topics  . Alcohol use: No  . Drug use: No    Home Medications Prior to Admission medications   Medication Sig Start Date End Date Taking? Authorizing Provider  amLODipine (NORVASC) 5 MG tablet Take 5 mg by mouth daily. 09/27/19  Yes [provider]  atenolol (TENORMIN) 100 MG tablet Take 50 mg by mouth daily.    Yes [provider]  escitalopram  (LEXAPRO) 20 MG tablet Take 20 mg by mouth daily.   Yes [provider]  loratadine (CLARITIN) 10 MG tablet Take 10 mg by mouth daily.   Yes [provider]  omeprazole (PRILOSEC) 20 MG capsule Take 1 capsule by mouth daily.   Yes [provider]  cholecalciferol (VITAMIN D) 1000 units tablet Take 1,000 Units by mouth daily.    [provider]  fluticasone (FLONASE) 50 MCG/ACT nasal spray Place 1 spray into both nostrils daily as needed for allergies or rhinitis.    [provider]  GUAIFENESIN PO Take 1 tablet by mouth daily as needed.    [provider]  Hypromellose (ARTIFICIAL TEARS) 0.4 % SOLN Apply 2 drops to eye 3 (three) times daily.    [provider]  Magnesium 250 MG TABS Take 250-500 mg by mouth at bedtime.    [provider]  methylphenidate (RITALIN) 10 MG tablet Take 10 mg by mouth daily.    [provider]  potassium chloride SA (K-DUR,KLOR-CON) 20 MEQ tablet Take 1 tablet by mouth daily.    [provider]    Allergies    Contrast media [iodinated diagnostic agents], Other, Allevess [capsaicin-menthol], Erythromycin, and Naproxen  Review of Systems   Review of Systems  Constitutional: Negative for appetite change and  fatigue.  HENT: Negative for congestion, ear discharge and sinus pressure.   Eyes: Negative for discharge.  Respiratory: Negative for cough.   Cardiovascular: Negative for chest pain.  Gastrointestinal: Positive for hematochezia. Negative for abdominal pain and diarrhea.       Rectal bleeding  Genitourinary: Negative for frequency and hematuria.  Musculoskeletal: Negative for back pain.  Skin: Negative for rash.  Neurological: Negative for seizures and headaches.  Psychiatric/Behavioral: Negative for hallucinations.    Physical Exam Updated Vital Signs BP (!) 134/93   Pulse 62   Temp 98.2 F (36.8 C) (Oral)   Resp 18   Ht 5\' 1"  (1.549 m)   Wt 77.1 kg   SpO2  97%   BMI 32.12 kg/m   Physical Exam Vitals and nursing note reviewed.  Constitutional:      Appearance: She is well-developed.  HENT:     Head: Normocephalic.     Nose: Nose normal.  Eyes:     General: No scleral icterus.    Conjunctiva/sclera: Conjunctivae normal.  Neck:     Thyroid: No thyromegaly.  Cardiovascular:     Rate and Rhythm: Normal rate and regular rhythm.     Heart sounds: No murmur. No friction rub. No gallop.   Pulmonary:     Breath sounds: No stridor. No wheezing or rales.  Chest:     Chest wall: No tenderness.  Abdominal:     General: There is no distension.     Tenderness: There is no abdominal tenderness. There is no rebound.  Genitourinary:    Comments: Rectal exam shows tenderness in the rectum with heme positive stool that is brown Musculoskeletal:        General: Normal range of motion.     Cervical back: Neck supple.  Lymphadenopathy:     Cervical: No cervical adenopathy.  Skin:    Findings: No erythema or rash.  Neurological:     Mental Status: She is oriented to person, place, and time.     Motor: No abnormal muscle tone.     Coordination: Coordination normal.  Psychiatric:        Behavior: Behavior normal.     ED Results / Procedures / Treatments   Labs (all labs ordered are listed, but only abnormal results are displayed) Labs Reviewed  COMPREHENSIVE METABOLIC PANEL - Abnormal; Notable for the following components:      Result Value   Glucose, Bld 119 (*)    Total Bilirubin 2.1 (*)    All other components within normal limits  POC OCCULT BLOOD, ED - Abnormal; Notable for the following components:   Fecal Occult Bld POSITIVE (*)    All other components within normal limits  CBC  TYPE AND SCREEN    EKG None  Radiology CT ABDOMEN PELVIS W CONTRAST  Result Date: 10/06/2019 CLINICAL DATA:  Rectal bleeding at for 2 days, bright red blood clots, rectal pain, history of colon cancer with chemotherapy and radiation therapy, colon  resection with colostomy and reversal, hysterectomy, appendectomy, hypertension at EXAM: CT ABDOMEN AND PELVIS WITH CONTRAST TECHNIQUE: Multidetector CT imaging of the abdomen and pelvis was performed using the standard protocol following bolus administration of intravenous contrast. Sagittal and coronal MPR images reconstructed from axial data set. A portion of the initial contrast dose administration infiltrated in the LEFT upper extremity above the antecubital fossa, I estimate 30 cc volume. Tolerated well by patient. Routine instructions given. New IV started for exam. Patient premedicated due to history of contrast  allergy. CONTRAST:  44mL OMNIPAQUE IOHEXOL 300 MG/ML SOLN IV. Dilute oral contrast. COMPARISON:  None FINDINGS: Lower chest: Subsegmental atelectasis at base of lingula. Respiratory motion artifacts at lung bases. Hepatobiliary: Mildly distended gallbladder up to 5.4 cm transverse. No calculi or wall thickening evident. Liver unremarkable. Pancreas: Normal appearance Spleen: Numerous calcified granulomata.  No mass. Adrenals/Urinary Tract: Adrenal glands unremarkable. Cyst at posterior upper LEFT kidney 3.3 x 2.7 cm. Adrenal glands, kidneys, ureters, and bladder otherwise normal. Stomach/Bowel: Appendix surgically absent by history. Prior low anterior resection of colon. Proximal stomach under distended with inadequate assessment of gastric wall thickness. Bowel loops otherwise unremarkable. Vascular/Lymphatic: Atherosclerotic calcifications aorta and iliac arteries without aneurysm. No adenopathy. Reproductive: Uterus surgically absent. Nonvisualization of ovaries. Other: No free air or free fluid. Tiny umbilical hernia containing fat. Small supraumbilical ventral hernia containing fat. Musculoskeletal: Osseous demineralization. Mild sclerosis at the pubic symphysis bilaterally. IMPRESSION: No acute intra-abdominal or intrapelvic abnormalities. Mildly distended gallbladder, nonspecific, without wall  thickening or surrounding inflammatory changes. LEFT renal cyst. Tiny umbilical and supraumbilical ventral hernias containing fat. Electronically Signed   By: Lavonia Dana M.D.   On: 10/06/2019 14:25    Procedures Procedures (including critical care time)  Medications Ordered in ED Medications  iohexol (OMNIPAQUE) 350 MG/ML injection 80 mL (has no administration in time range)  hydrocortisone sodium succinate (SOLU-CORTEF) injection 200 mg (200 mg Intravenous Given 10/06/19 0924)  diphenhydrAMINE (BENADRYL) injection 50 mg (50 mg Intravenous Given 10/06/19 1218)  sodium chloride 0.9 % bolus 1,000 mL (0 mLs Intravenous Stopped 10/06/19 1217)  iohexol (OMNIPAQUE) 300 MG/ML solution 80 mL (80 mLs Intravenous Contrast Given 10/06/19 1338)    ED Course  I have reviewed the triage vital signs and the nursing notes.  Pertinent labs & imaging results that were available during my care of the patient were reviewed by me and considered in my medical decision making (see chart for details).    MDM Rules/Calculators/A&P                      Patient with rectal bleeding.  CT scan unremarkable.  Patient with history of colon cancer.  She is referred back to GI for follow-up care    This patient presents to the ED for concern of rectal bleeding, this involves an extensive number of treatment options, and is a complaint that carries with it a high risk of complications and morbidity.  The differential diagnosis includes upper GI bleed lower GI bleed rectal cancer   Lab Tests:   I Ordered, reviewed, and interpreted labs, which included CBC and chemistries that are unremarkable  Medicines ordered:   I ordered medication IV fluids for dehydration  Imaging Studies ordered:   I ordered imaging studies which included CT abdomen and  I independently visualized and interpreted imaging which showed unremarkable  Additional history obtained:   Additional history obtained from medical  record  Previous records obtained and reviewed   Consultations Obtained:   Reevaluation:  After the interventions stated above, I reevaluated the patient and found, patient improved with weakness with fluids  Critical Interventions: Patient with history of colon cancer and rectal bleeding.  She will follow up with GI .   Final Clinical Impression(s) / ED Diagnoses Final diagnoses:  Rectal bleeding    Rx / DC Orders ED Discharge Orders    None       Milton Ferguson, MD 10/06/19 1538

## 2019-10-09 ENCOUNTER — Ambulatory Visit: Payer: Medicare Other | Admitting: Orthopaedic Surgery

## 2019-10-16 ENCOUNTER — Other Ambulatory Visit: Payer: Self-pay

## 2019-10-16 ENCOUNTER — Ambulatory Visit (INDEPENDENT_AMBULATORY_CARE_PROVIDER_SITE_OTHER): Payer: Medicare Other | Admitting: Orthopaedic Surgery

## 2019-10-16 DIAGNOSIS — M1711 Unilateral primary osteoarthritis, right knee: Secondary | ICD-10-CM

## 2019-10-16 NOTE — Progress Notes (Signed)
   Procedure Note  Patient: Caitlin Mckinney             Date of Birth: April 10, 1951           MRN: [id number]             Visit Date: 10/16/2019  Procedures: Visit Diagnoses:  1. Unilateral primary osteoarthritis, right knee     Large Joint Inj: R knee on 10/16/2019 9:15 AM Indications: diagnostic evaluation and pain Details: 22 G 1.5 in needle, superolateral approach  Arthrogram: No  Outcome: tolerated well, no immediate complications Procedure, treatment alternatives, risks and benefits explained, specific risks discussed. Consent was given by the patient. Immediately prior to procedure a time out was called to verify the correct patient, procedure, equipment, support staff and site/side marked as required. Patient was prepped and draped in the usual sterile fashion.    The patient comes in today for scheduled hyaluronic acid injection with Monovisc in her right knee to treat the pain from osteoarthritis.  These types of injections have helped greatly for her in the past.  She states that at one her left knee has lasted for a year and a half.  She has had no other acute change in her medical status.  She denies any instability of her right knee.  On exam there is no effusion of her right knee with good range of motion but just global tenderness.  I did place Monovisc in the right knee without difficulty today.  All questions and concerns were answered and addressed.  She understands that if there is any issues she is to let us know.  Follow-up is otherwise as needed.

## 2020-10-03 IMAGING — CT CT ABD-PELV W/ CM
3 of 5 series · 16 of 46 positions shown, 18 images · IV contrast (Omnipaque or Isovue)
Comparison: None

CLINICAL DATA: Rectal bleeding at for 2 days, bright red blood
clots, rectal pain, history of colon cancer with chemotherapy and
radiation therapy, colon resection with colostomy and reversal,
hysterectomy, appendectomy, hypertension at

EXAM:
CT ABDOMEN AND PELVIS WITH CONTRAST
TECHNIQUE: Multidetector CT imaging of the abdomen and pelvis was performed
using the standard protocol following bolus administration of
intravenous contrast. Sagittal and coronal MPR images reconstructed
from axial data set. A portion of the initial contrast dose
administration infiltrated in the LEFT upper extremity above the
antecubital fossa, I estimate 30 cc volume. Tolerated well by
patient. Routine instructions given. New IV started for exam.
Patient premedicated due to history of contrast allergy.
CONTRAST:  80mL OMNIPAQUE IOHEXOL 300 MG/ML SOLN IV. Dilute oral
contrast.

[Series 3: axial st · axial · 0.86mm/px · z∈[+1047,+1397]mm · 11 of 84 slices shown, 13 images]
[im 7/84  soft-tissue]
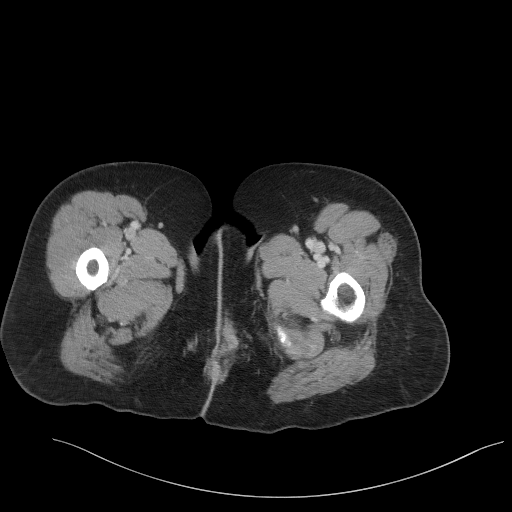
[im 7/84  bone]
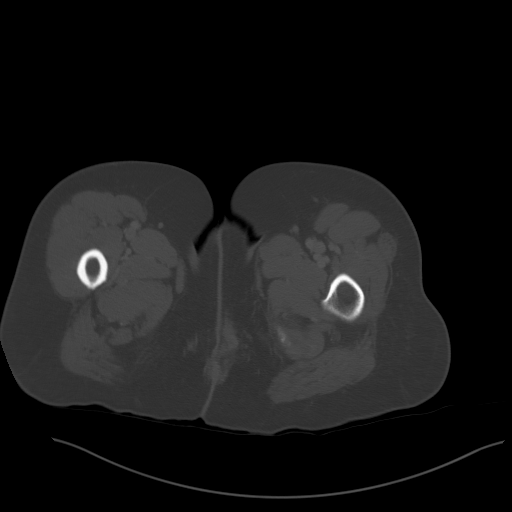
[im 14/84  soft-tissue]
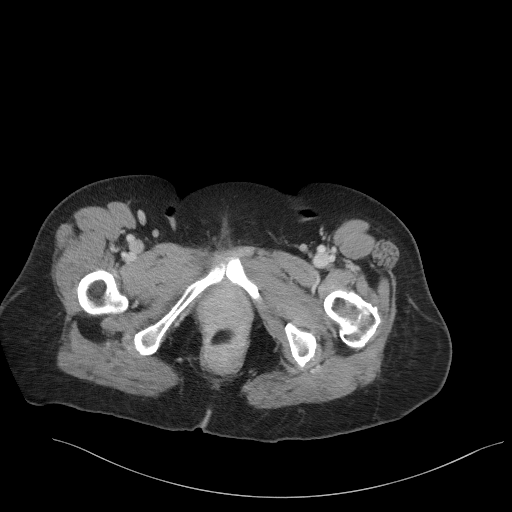
[im 21/84  soft-tissue]
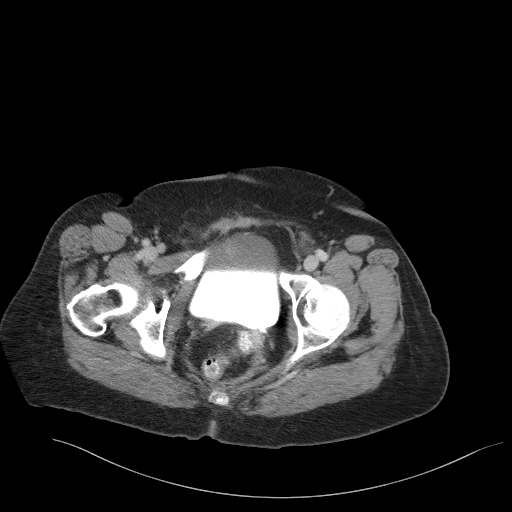
[im 28/84  soft-tissue]
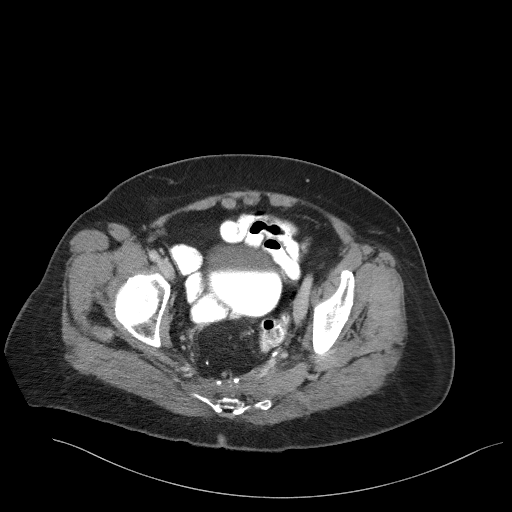
[im 35/84  soft-tissue]
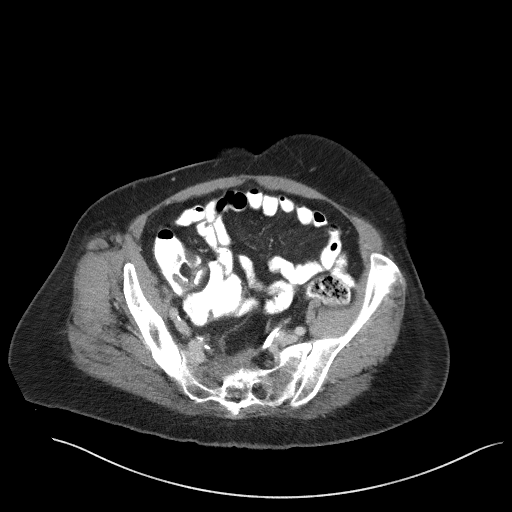
[im 42/84  soft-tissue]
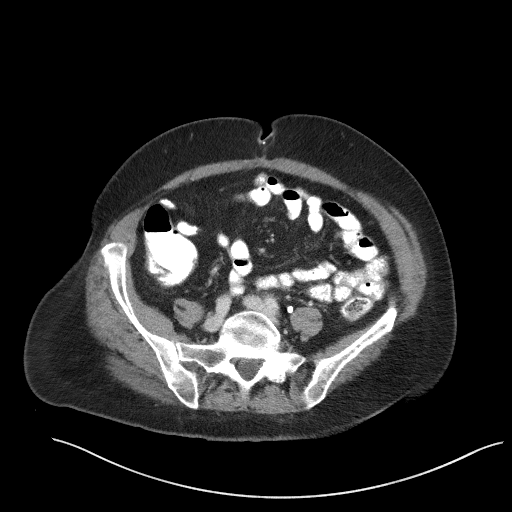
[im 49/84  soft-tissue]
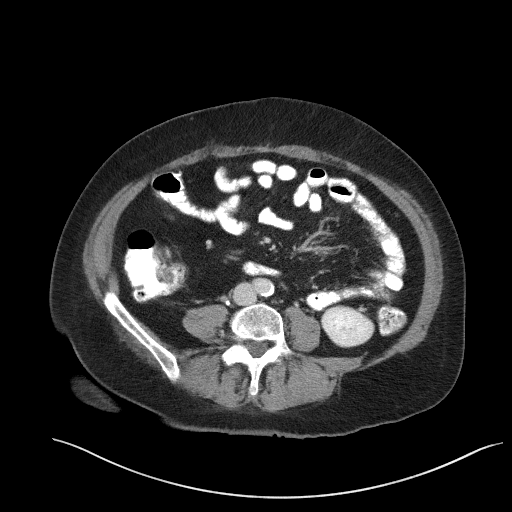
[im 56/84  soft-tissue]
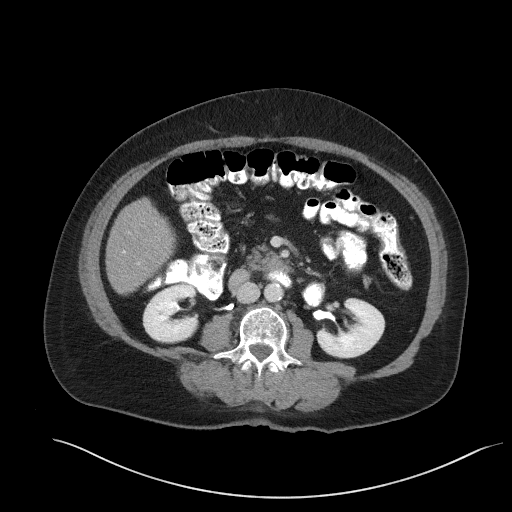
[im 63/84  soft-tissue]
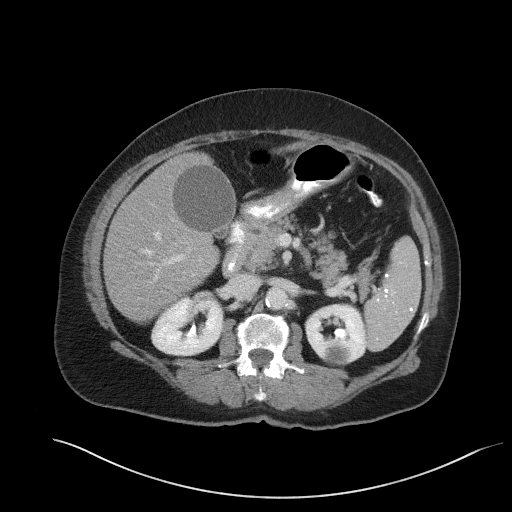
[im 63/84  bone]
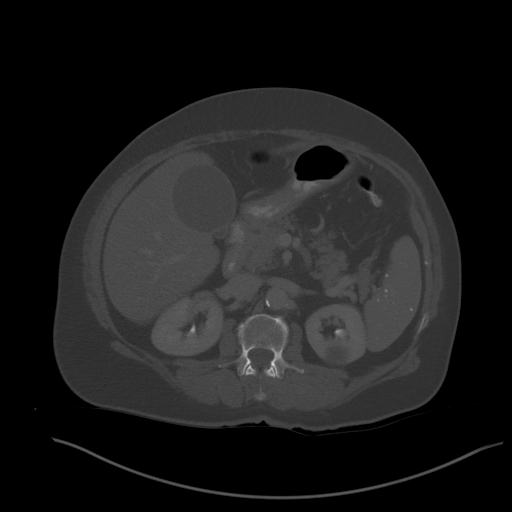
[im 70/84  soft-tissue]
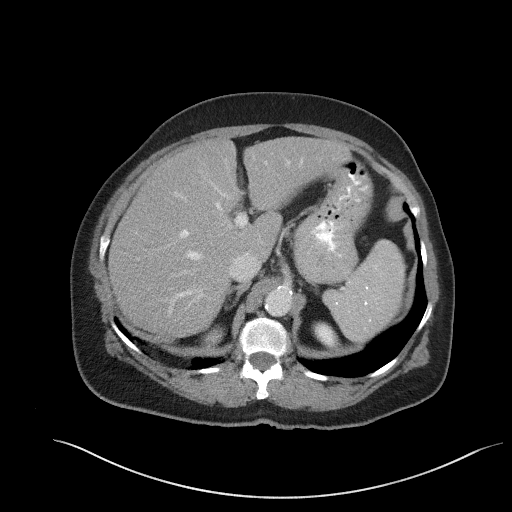
[im 77/84  soft-tissue]
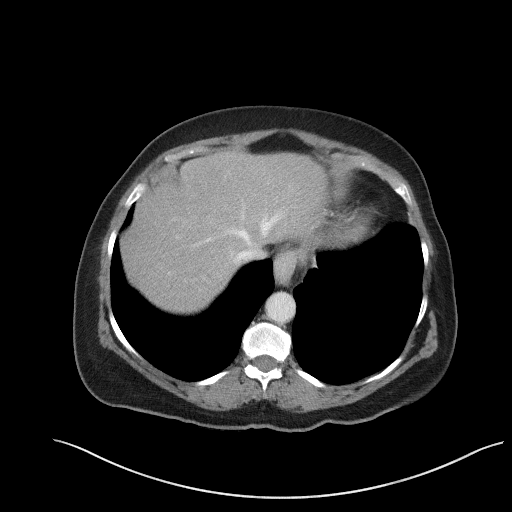

[Series 6: coronal st · coronal · 0.77mm/px · 3 of 108 slices shown]
[im 36/108  soft-tissue]
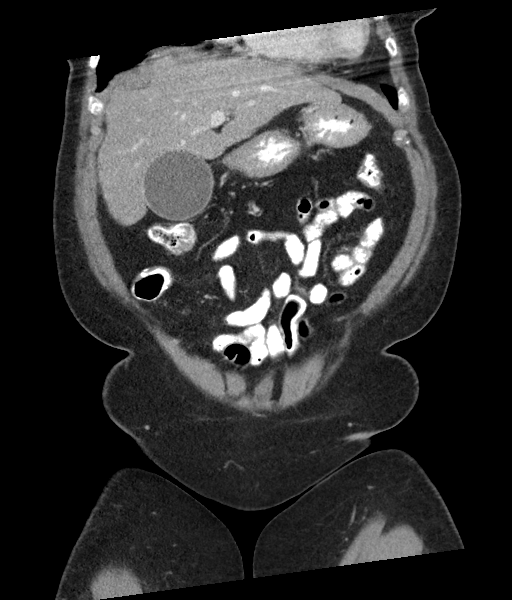
[im 48/108  soft-tissue]
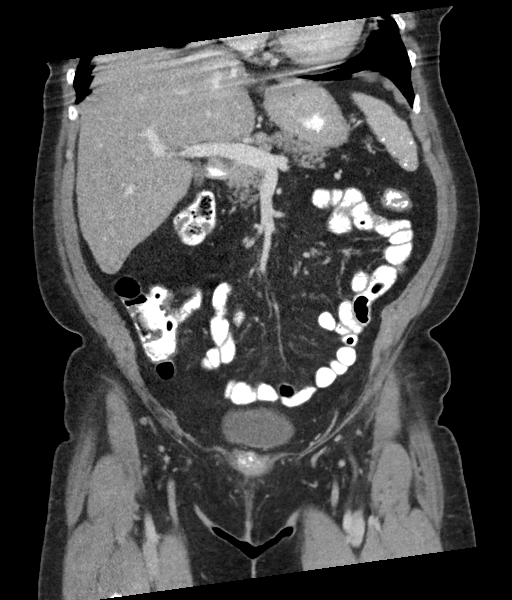
[im 60/108  soft-tissue]
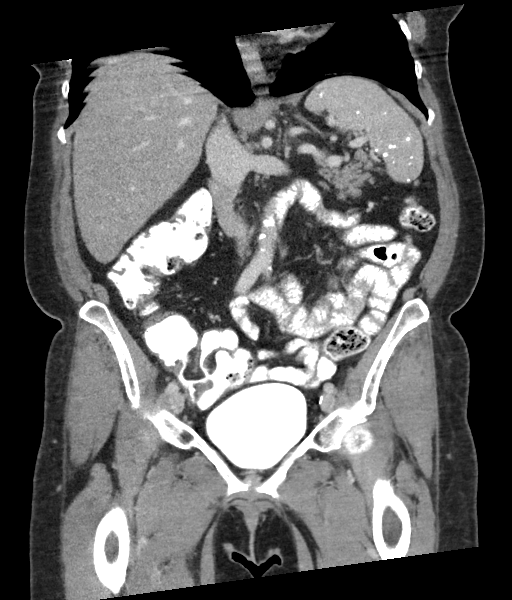

[Series 8: delay · axial · delayed · 0.82mm/px · z∈[+1258,+1298]mm · 2 of 46 slices shown]
[im 8/46  soft-tissue]
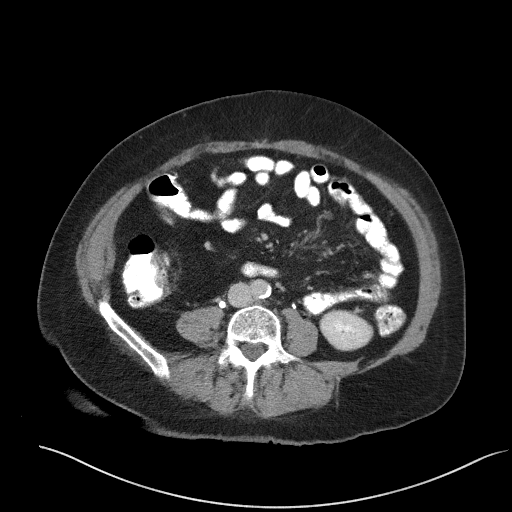
[im 16/46  soft-tissue]
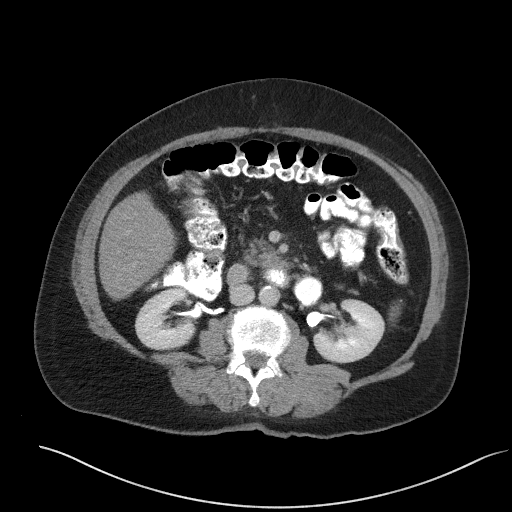

[16 of 46 positions shown; findings below may reference images not displayed]

FINDINGS: Lower chest: Subsegmental atelectasis at base of lingula.
Respiratory motion artifacts at lung bases.

Hepatobiliary: Mildly distended gallbladder up to 5.4 cm transverse.
No calculi or wall thickening evident. Liver unremarkable.

Pancreas: Normal appearance

Spleen: Numerous calcified granulomata.  No mass.

Adrenals/Urinary Tract: Adrenal glands unremarkable. Cyst at
posterior upper LEFT kidney 3.3 x 2.7 cm. Adrenal glands, kidneys,
ureters, and bladder otherwise normal.

Stomach/Bowel: Appendix surgically absent by history. Prior low
anterior resection of colon. Proximal stomach under distended with
inadequate assessment of gastric wall thickness. Bowel loops
otherwise unremarkable.

Vascular/Lymphatic: Atherosclerotic calcifications aorta and iliac
arteries without aneurysm. No adenopathy.

Reproductive: Uterus surgically absent. Nonvisualization of ovaries.

Other: No free air or free fluid. Tiny umbilical hernia containing
fat. Small supraumbilical ventral hernia containing fat.

Musculoskeletal: Osseous demineralization. Mild sclerosis at the
pubic symphysis bilaterally.
IMPRESSION: No acute intra-abdominal or intrapelvic abnormalities.

Mildly distended gallbladder, nonspecific, without wall thickening
or surrounding inflammatory changes.

LEFT renal cyst.

Tiny umbilical and supraumbilical ventral hernias containing fat.

## 2021-03-31 DIAGNOSIS — G893 Neoplasm related pain (acute) (chronic): Secondary | ICD-10-CM | POA: Insufficient documentation

## 2021-04-03 DIAGNOSIS — Z8540 Personal history of malignant neoplasm of unspecified female genital organ: Secondary | ICD-10-CM | POA: Insufficient documentation

## 2021-04-03 DIAGNOSIS — F329 Major depressive disorder, single episode, unspecified: Secondary | ICD-10-CM | POA: Insufficient documentation

## 2021-12-24 DIAGNOSIS — C7951 Secondary malignant neoplasm of bone: Secondary | ICD-10-CM | POA: Insufficient documentation

## 2022-01-23 DIAGNOSIS — Z5111 Encounter for antineoplastic chemotherapy: Secondary | ICD-10-CM | POA: Insufficient documentation

## 2022-02-19 ENCOUNTER — Encounter (HOSPITAL_COMMUNITY): Payer: Self-pay | Admitting: Emergency Medicine

## 2022-02-19 ENCOUNTER — Emergency Department (HOSPITAL_COMMUNITY)
Admission: EM | Admit: 2022-02-19 | Discharge: 2022-02-20 | Disposition: A | Discharge status: Home / Self Care | Payer: Medicare Other | Attending: Emergency Medicine | Admitting: Emergency Medicine

## 2022-02-19 ENCOUNTER — Emergency Department (HOSPITAL_COMMUNITY): Payer: Medicare Other

## 2022-02-19 DIAGNOSIS — R109 Unspecified abdominal pain: Secondary | ICD-10-CM | POA: Insufficient documentation

## 2022-02-19 DIAGNOSIS — Z85828 Personal history of other malignant neoplasm of skin: Secondary | ICD-10-CM | POA: Diagnosis not present

## 2022-02-19 LAB — URINALYSIS, ROUTINE W REFLEX MICROSCOPIC
Bacteria, UA: NONE SEEN
Bilirubin Urine: NEGATIVE
Glucose, UA: NEGATIVE mg/dL
Hgb urine dipstick: NEGATIVE
Ketones, ur: 5 mg/dL — AB
Nitrite: NEGATIVE
Protein, ur: NEGATIVE mg/dL
Specific Gravity, Urine: 1.018 (ref 1.005–1.030)
pH: 6 (ref 5.0–8.0)

## 2022-02-19 LAB — COMPREHENSIVE METABOLIC PANEL
ALT: 18 U/L (ref 0–44)
AST: 27 U/L (ref 15–41)
Albumin: 3.2 g/dL — ABNORMAL LOW (ref 3.5–5.0)
Alkaline Phosphatase: 99 U/L (ref 38–126)
Anion gap: 5 (ref 5–15)
BUN: 15 mg/dL (ref 8–23)
CO2: 27 mmol/L (ref 22–32)
Calcium: 8.5 mg/dL — ABNORMAL LOW (ref 8.9–10.3)
Chloride: 101 mmol/L (ref 98–111)
Creatinine, Ser: 0.47 mg/dL (ref 0.44–1.00)
GFR, Estimated: >60 mL/min (ref >60)
Glucose, Bld: 102 mg/dL — ABNORMAL HIGH (ref 70–99)
Potassium: 3.6 mmol/L (ref 3.5–5.1)
Sodium: 133 mmol/L — ABNORMAL LOW (ref 135–145)
Total Bilirubin: 1.2 mg/dL (ref 0.3–1.2)
Total Protein: 6.8 g/dL (ref 6.5–8.1)

## 2022-02-19 LAB — CBC
HCT: 37.3 % (ref 36.0–46.0)
Hemoglobin: 12.1 g/dL (ref 12.0–15.0)
MCH: 27.9 pg (ref 26.0–34.0)
MCHC: 32.4 g/dL (ref 30.0–36.0)
MCV: 85.9 fL (ref 80.0–100.0)
Platelets: 319 10*3/uL (ref 150–400)
RBC: 4.34 MIL/uL (ref 3.87–5.11)
RDW: 14.5 % (ref 11.5–15.5)
WBC: 6.9 10*3/uL (ref 4.0–10.5)
nRBC: 0 % (ref 0.0–0.2)

## 2022-02-19 LAB — LIPASE, BLOOD: Lipase: 45 U/L (ref 11–51)

## 2022-02-19 MED ORDER — HEPARIN SOD (PORK) LOCK FLUSH 100 UNIT/ML IV SOLN
500.0000 [IU] | Freq: Once | INTRAVENOUS | Status: AC
  Administered 2022-02-20: 500 [IU] via INTRAVENOUS
  Filled 2022-02-19: qty 5

## 2022-02-19 MED ORDER — ONDANSETRON HCL 4 MG/2ML IJ SOLN
4.0000 mg | Freq: Once | INTRAMUSCULAR | Status: AC | PRN
  Administered 2022-02-19: 4 mg via INTRAVENOUS
  Filled 2022-02-19: qty 2

## 2022-02-19 MED ORDER — SODIUM CHLORIDE 0.9 % IV BOLUS
1000.0000 mL | Freq: Once | INTRAVENOUS | Status: AC
  Administered 2022-02-19: 1000 mL via INTRAVENOUS

## 2022-02-19 MED ORDER — POLYETHYLENE GLYCOL 3350 17 G PO PACK: 17.0000 g | PACK | Freq: Two times a day (BID) | ORAL | 0 refills | Status: AC | PRN

## 2022-02-19 MED ORDER — HYDROMORPHONE HCL 1 MG/ML IJ SOLN
0.5000 mg | Freq: Once | INTRAMUSCULAR | Status: AC
  Administered 2022-02-19: 0.5 mg via INTRAVENOUS
  Filled 2022-02-19: qty 0.5

## 2022-02-19 MED ORDER — DOCUSATE SODIUM 100 MG PO CAPS: 100.0000 mg | ORAL_CAPSULE | Freq: Two times a day (BID) | ORAL | 0 refills | Status: AC

## 2022-02-19 NOTE — Discharge Instructions (Addendum)
Take Miralax and Colace to help with your decreased colostomy output.   If you develop worsening, continued, or recurrent abdominal pain, uncontrolled vomiting, fever, chest or back pain, or any other new/concerning symptoms then return to the ER for evaluation.

## 2022-02-19 NOTE — ED Notes (Signed)
Pt ambulated to restroom with steady gait. [employer] RN

## 2022-02-19 NOTE — ED Provider Notes (Signed)
[facility] EMERGENCY DEPARTMENT Provider Note   CSN: [id number] Arrival date & time: 02/19/22  2048     History  Chief Complaint  Patient presents with   Abdominal Pain    Caitlin Mckinney is a 71 y.o. female.  HPI 71 year old female with an ostomy as well as metastatic cancer that is currently being treated with [facility] presents with abdominal pain and decreased ostomy output.  She is concerned for a blockage.  This has been ongoing for 3 days but is worse today.  Pain is intractable.  Pain is near the ostomy primarily.  Home Medications Prior to Admission medications   Medication Sig Start Date End Date Taking? Authorizing Provider  amLODipine (NORVASC) 5 MG tablet Take 5 mg by mouth daily. 09/27/19   [provider]  atenolol (TENORMIN) 100 MG tablet Take 50 mg by mouth daily.     [provider]  cholecalciferol (VITAMIN D) 1000 units tablet Take 1,000 Units by mouth daily.    [provider]  escitalopram (LEXAPRO) 20 MG tablet Take 20 mg by mouth daily.    [provider]  fluticasone (FLONASE) 50 MCG/ACT nasal spray Place 1 spray into both nostrils daily as needed for allergies or rhinitis.    [provider]  GUAIFENESIN PO Take 1 tablet by mouth daily as needed.    [provider]  Hypromellose (ARTIFICIAL TEARS) 0.4 % SOLN Apply 2 drops to eye 3 (three) times daily.    [provider]  loratadine (CLARITIN) 10 MG tablet Take 10 mg by mouth daily.    [provider]  Magnesium 250 MG TABS Take 250-500 mg by mouth at bedtime.    [provider]  methylphenidate (RITALIN) 10 MG tablet Take 10 mg by mouth daily.    [provider]  omeprazole (PRILOSEC) 20 MG capsule Take 1 capsule by mouth daily.    [provider]  potassium chloride SA (K-DUR,KLOR-CON) 20 MEQ tablet Take 1 tablet by mouth daily.    [provider]      Allergies    Contrast media [iodinated contrast  media], Other, Allevess [capsaicin-menthol], Erythromycin, and Naproxen    Review of Systems   Review of Systems  Gastrointestinal:  Positive for abdominal pain and constipation. Negative for abdominal distention and blood in stool.    Physical Exam Updated Vital Signs BP (!) 141/67 (BP Location: Right Arm)   Pulse 69   Temp 97.8 F (36.6 C) (Oral)   Resp 20   Ht 5' (1.524 m)   Wt 56.2 kg   SpO2 97%   BMI 24.22 kg/m  Physical Exam Vitals and nursing note reviewed.  Constitutional:      Appearance: She is well-developed.  HENT:     Head: Normocephalic and atraumatic.  Cardiovascular:     Rate and Rhythm: Normal rate and regular rhythm.     Heart sounds: Normal heart sounds.  Pulmonary:     Effort: Pulmonary effort is normal.     Breath sounds: Normal breath sounds.  Abdominal:     Palpations: Abdomen is soft.     Tenderness: There is abdominal tenderness (tenderness is primarily around ostomy site. small amount of nonbloody stool in bag).  Skin:    General: Skin is warm and dry.  Neurological:     Mental Status: She is alert.     ED Results / Procedures / Treatments   Labs (all labs ordered are listed, but only abnormal results are displayed)  Labs Reviewed  LIPASE, BLOOD  COMPREHENSIVE METABOLIC PANEL  CBC  URINALYSIS, ROUTINE W REFLEX MICROSCOPIC    EKG None  Radiology No results found.  Procedures Procedures    Medications Ordered in ED Medications  sodium chloride 0.9 % bolus 1,000 mL (has no administration in time range)  HYDROmorphone (DILAUDID) injection 0.5 mg (has no administration in time range)  ondansetron (ZOFRAN) injection 4 mg (has no administration in time range)    ED Course/ Medical Decision Making/ A&P                           Medical Decision Making Amount and/or Complexity of Data Reviewed Labs: ordered. Radiology: ordered.  Risk OTC drugs. Prescription drug management.   Work-up is overall unremarkable.  Labs are  benign including normal WBC and hemoglobin.  No renal failure.  Urinalysis does not seem consistent with UTI, especially given the primarily left upper quadrant pain.  CT images viewed/interpreted by myself and there is no obvious bowel obstruction.  There is a fair amount of stool and I wonder if this is more of a constipation issue.  We will start her on MiraLAX and Colace.  Otherwise pain seems controlled and she appears stable for discharge home.  Given return precautions.        Final Clinical Impression(s) / ED Diagnoses Final diagnoses:  None    Rx / DC Orders ED Discharge Orders     None         Sherwood Gambler, MD 02/20/22 0000

## 2022-02-19 NOTE — ED Triage Notes (Signed)
Pt from home via EMS  c/o left sodeed abdominal pain x 3 days. Colostomy present. Pt reports decreased output over the last few days. Last changed earlier today.  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  Caitlin Mckinney Caitlin Mckinney

## 2022-02-20 DIAGNOSIS — R109 Unspecified abdominal pain: Secondary | ICD-10-CM | POA: Diagnosis not present

## 2022-03-09 ENCOUNTER — Other Ambulatory Visit: Payer: Self-pay | Admitting: Nurse Practitioner

## 2022-03-25 ENCOUNTER — Encounter (HOSPITAL_COMMUNITY): Payer: Self-pay | Admitting: Emergency Medicine

## 2022-03-25 ENCOUNTER — Inpatient Hospital Stay (HOSPITAL_COMMUNITY)
Admission: EM | Admit: 2022-03-25 | Discharge: 2022-03-30 | DRG: 481 | Disposition: A | Discharge status: Skilled Nursing Facility | Payer: Medicare Other | Attending: Internal Medicine | Admitting: Internal Medicine

## 2022-03-25 ENCOUNTER — Emergency Department (HOSPITAL_COMMUNITY): Payer: Medicare Other

## 2022-03-25 DIAGNOSIS — Z90710 Acquired absence of both cervix and uterus: Secondary | ICD-10-CM

## 2022-03-25 DIAGNOSIS — S42201A Unspecified fracture of upper end of right humerus, initial encounter for closed fracture: Secondary | ICD-10-CM

## 2022-03-25 DIAGNOSIS — S42214A Unspecified nondisplaced fracture of surgical neck of right humerus, initial encounter for closed fracture: Secondary | ICD-10-CM | POA: Diagnosis present

## 2022-03-25 DIAGNOSIS — Z28310 Unvaccinated for covid-19: Secondary | ICD-10-CM

## 2022-03-25 DIAGNOSIS — Z85038 Personal history of other malignant neoplasm of large intestine: Secondary | ICD-10-CM

## 2022-03-25 DIAGNOSIS — S72011A Unspecified intracapsular fracture of right femur, initial encounter for closed fracture: Principal | ICD-10-CM | POA: Diagnosis present

## 2022-03-25 DIAGNOSIS — Z888 Allergy status to other drugs, medicaments and biological substances status: Secondary | ICD-10-CM

## 2022-03-25 DIAGNOSIS — S72144A Nondisplaced intertrochanteric fracture of right femur, initial encounter for closed fracture: Principal | ICD-10-CM

## 2022-03-25 DIAGNOSIS — Z881 Allergy status to other antibiotic agents status: Secondary | ICD-10-CM

## 2022-03-25 DIAGNOSIS — R339 Retention of urine, unspecified: Secondary | ICD-10-CM | POA: Diagnosis present

## 2022-03-25 DIAGNOSIS — Z79899 Other long term (current) drug therapy: Secondary | ICD-10-CM

## 2022-03-25 DIAGNOSIS — W010XXA Fall on same level from slipping, tripping and stumbling without subsequent striking against object, initial encounter: Secondary | ICD-10-CM | POA: Diagnosis present

## 2022-03-25 DIAGNOSIS — F39 Unspecified mood [affective] disorder: Secondary | ICD-10-CM | POA: Diagnosis present

## 2022-03-25 DIAGNOSIS — D630 Anemia in neoplastic disease: Secondary | ICD-10-CM | POA: Diagnosis present

## 2022-03-25 DIAGNOSIS — Y93K1 Activity, walking an animal: Secondary | ICD-10-CM

## 2022-03-25 DIAGNOSIS — Z91041 Radiographic dye allergy status: Secondary | ICD-10-CM

## 2022-03-25 DIAGNOSIS — Z85118 Personal history of other malignant neoplasm of bronchus and lung: Secondary | ICD-10-CM

## 2022-03-25 DIAGNOSIS — E876 Hypokalemia: Secondary | ICD-10-CM | POA: Diagnosis present

## 2022-03-25 DIAGNOSIS — S72009A Fracture of unspecified part of neck of unspecified femur, initial encounter for closed fracture: Secondary | ICD-10-CM | POA: Diagnosis not present

## 2022-03-25 DIAGNOSIS — K219 Gastro-esophageal reflux disease without esophagitis: Secondary | ICD-10-CM | POA: Diagnosis present

## 2022-03-25 DIAGNOSIS — Z933 Colostomy status: Secondary | ICD-10-CM

## 2022-03-25 DIAGNOSIS — S42213A Unspecified displaced fracture of surgical neck of unspecified humerus, initial encounter for closed fracture: Secondary | ICD-10-CM

## 2022-03-25 DIAGNOSIS — I10 Essential (primary) hypertension: Secondary | ICD-10-CM | POA: Diagnosis present

## 2022-03-25 DIAGNOSIS — C7951 Secondary malignant neoplasm of bone: Secondary | ICD-10-CM | POA: Diagnosis present

## 2022-03-25 DIAGNOSIS — Z87891 Personal history of nicotine dependence: Secondary | ICD-10-CM

## 2022-03-25 MED ORDER — HYDROMORPHONE HCL 1 MG/ML IJ SOLN
1.0000 mg | Freq: Once | INTRAMUSCULAR | Status: AC
  Administered 2022-03-26: 1 mg via INTRAVENOUS
  Filled 2022-03-25: qty 1

## 2022-03-25 NOTE — ED Triage Notes (Signed)
Pt BIB [facility] after mechanical fall tonight outside while walking her dog. Pt c/o right hip and right shoulder pain.   Pt currently has bone and lung CA, pt takes dilaudid PO at home.   EMS gave 66mcg fentanyl and 12.5mg  phenergan. 18 L AC.

## 2022-03-25 NOTE — ED Provider Notes (Signed)
[facility] Provider Note   CSN: [id number] Arrival date & time: 03/25/22  2244     History  Chief Complaint  Patient presents with   Caitlin Mckinney Caitlin Mckinney is a 71 y.o. female.  Patient is a 71 year old female with past medical history of metastatic cancer.  Patient presenting today after a fall.  She was walking her dog this evening when the dog pulled the leash and caused her to fall.  She landed on her right side.  She is having severe pain in the right hip and right shoulder.  She denies any numbness or tingling.  She denies having struck her head or lost consciousness.  She denies other complaints.  Pain is exacerbated with movement.  There are no alleviating factors.  The history is provided by the patient.       Home Medications Prior to Admission medications   Medication Sig Start Date End Date Taking? Authorizing Provider  amLODipine (NORVASC) 5 MG tablet Take 5 mg by mouth daily. 09/27/19   [provider]  atenolol (TENORMIN) 100 MG tablet Take 50 mg by mouth daily.     [provider]  cholecalciferol (VITAMIN D) 1000 units tablet Take 1,000 Units by mouth daily.    [provider]  docusate sodium (COLACE) 100 MG capsule Take 1 capsule (100 mg total) by mouth every 12 (twelve) hours. 02/19/22   Sherwood Gambler, MD  escitalopram (LEXAPRO) 20 MG tablet Take 20 mg by mouth daily.    [provider]  fluticasone (FLONASE) 50 MCG/ACT nasal spray Place 1 spray into both nostrils daily as needed for allergies or rhinitis.    [provider]  GUAIFENESIN PO Take 1 tablet by mouth daily as needed.    [provider]  Hypromellose (ARTIFICIAL TEARS) 0.4 % SOLN Apply 2 drops to eye 3 (three) times daily.    [provider]  loratadine (CLARITIN) 10 MG tablet Take 10 mg by mouth daily.    [provider]  Magnesium 250 MG TABS Take 250-500 mg by mouth at bedtime.    [provider]  methylphenidate (RITALIN) 10 MG tablet Take 10 mg by mouth daily.    [provider]  omeprazole (PRILOSEC) 20 MG capsule Take 1 capsule by mouth daily.    [provider]  polyethylene glycol (MIRALAX / GLYCOLAX) 17 g packet Take 17 g by mouth 2 (two) times daily as needed. 02/19/22   Sherwood Gambler, MD  potassium chloride SA (K-DUR,KLOR-CON) 20 MEQ tablet Take 1 tablet by mouth daily.    [provider]      Allergies    Contrast media [iodinated contrast media], Other, Allevess [capsaicin-menthol], Erythromycin, and Naproxen    Review of Systems   Review of Systems  All other systems reviewed and are negative.   Physical Exam Updated Vital Signs BP (!) 180/86 (BP Location: Left Arm)   Pulse 77   Temp 98 F (36.7 C)   Resp 20   Ht 5' (1.524 m)   Wt 56.2 kg   SpO2 100%   BMI 24.20 kg/m  Physical Exam Vitals and nursing note reviewed.  Constitutional:      General: She is not in acute distress.    Appearance: She is well-developed. She is not diaphoretic.  HENT:     Head: Normocephalic and atraumatic.  Cardiovascular:     Rate and Rhythm: Normal rate and regular rhythm.     Heart  sounds: No murmur heard.    No friction rub. No gallop.  Pulmonary:     Effort: Pulmonary effort is normal. No respiratory distress.     Breath sounds: Normal breath sounds. No wheezing.  Abdominal:     General: Bowel sounds are normal. There is no distension.     Palpations: Abdomen is soft.     Tenderness: There is no abdominal tenderness.  Musculoskeletal:        General: Normal range of motion.     Cervical back: Normal range of motion and neck supple.     Comments: There is tenderness to palpation overlying the lateral aspect of the right hip.  There is no shortening or rotation of the leg.  DP pulses are easily palpable and motor and sensation is intact throughout the entire foot.  The right shoulder appears grossly normal without deformity or  significant swelling.  Ulnar and radial pulses are easily palpable and motor and sensation are intact throughout the entire hand.  Skin:    General: Skin is warm and dry.  Neurological:     General: No focal deficit present.     Mental Status: She is alert and oriented to person, place, and time.     ED Results / Procedures / Treatments   Labs (all labs ordered are listed, but only abnormal results are displayed) Labs Reviewed - No data to display  EKG None  Radiology DG Shoulder Right  Result Date: 03/25/2022 CLINICAL DATA:  Fall, pain EXAM: RIGHT SHOULDER - 2+ VIEW COMPARISON:  None Available. FINDINGS: There is cortical irregularity noted in the lateral humeral neck region. No well-defined fracture crosses the humeral neck, but findings are suspicious for occult fracture. No subluxation or dislocation. Numerous pulmonary nodules and masses within the right lung. IMPRESSION: Cortical irregularity laterally in the region of the humeral neck. Findings concerning for subtle or occult humeral neck fracture. Consider further evaluation with CT. Numerous pulmonary metastases. Electronically Signed   By: Rolm Baptise M.D.   On: 03/25/2022 23:19   DG Hip Unilat W or Wo Pelvis 2-3 Views Right  Result Date: 03/25/2022 CLINICAL DATA:  Fall, pain EXAM: DG HIP (WITH OR WITHOUT PELVIS) 2-3V RIGHT COMPARISON:  None Available. FINDINGS: Mild degenerative changes in the hips, symmetric. SI joints symmetric and unremarkable. No acute bony abnormality. Specifically, no fracture, subluxation, or dislocation. IMPRESSION: No acute bony abnormality. Electronically Signed   By: Rolm Baptise M.D.   On: 03/25/2022 23:17    Procedures Procedures  {Document cardiac monitor, telemetry assessment procedure when appropriate:1}  Medications Ordered in ED Medications  HYDROmorphone (DILAUDID) injection 1 mg (has no administration in time range)    ED Course/ Medical Decision Making/ A&P                            Medical Decision Making Amount and/or Complexity of Data Reviewed Radiology: ordered.  Risk Prescription drug management.   ***  {Document critical care time when appropriate:1} {Document review of labs and clinical decision tools ie heart score, Chads2Vasc2 etc:1}  {Document your independent review of radiology images, and any outside records:1} {Document your discussion with family members, caretakers, and with consultants:1} {Document social determinants of health affecting pt's care:1} {Document your decision making why or why not admission, treatments were needed:1} Final Clinical Impression(s) / ED Diagnoses Final diagnoses:  None    Rx / DC Orders ED Discharge Orders     None

## 2022-03-26 ENCOUNTER — Inpatient Hospital Stay (HOSPITAL_COMMUNITY): Payer: Medicare Other | Admitting: Anesthesiology

## 2022-03-26 ENCOUNTER — Encounter (HOSPITAL_COMMUNITY): Payer: Self-pay | Admitting: Family Medicine

## 2022-03-26 ENCOUNTER — Other Ambulatory Visit: Payer: Self-pay

## 2022-03-26 ENCOUNTER — Inpatient Hospital Stay (HOSPITAL_COMMUNITY): Payer: Medicare Other

## 2022-03-26 ENCOUNTER — Encounter (HOSPITAL_COMMUNITY)
Admission: EM | Disposition: A | Discharge status: Skilled Nursing Facility | Payer: Self-pay | Source: Home / Self Care | Attending: Internal Medicine

## 2022-03-26 DIAGNOSIS — Y93K1 Activity, walking an animal: Secondary | ICD-10-CM | POA: Diagnosis not present

## 2022-03-26 DIAGNOSIS — S42201A Unspecified fracture of upper end of right humerus, initial encounter for closed fracture: Secondary | ICD-10-CM

## 2022-03-26 DIAGNOSIS — R339 Retention of urine, unspecified: Secondary | ICD-10-CM | POA: Diagnosis present

## 2022-03-26 DIAGNOSIS — Z881 Allergy status to other antibiotic agents status: Secondary | ICD-10-CM | POA: Diagnosis not present

## 2022-03-26 DIAGNOSIS — Z87891 Personal history of nicotine dependence: Secondary | ICD-10-CM | POA: Diagnosis not present

## 2022-03-26 DIAGNOSIS — C7951 Secondary malignant neoplasm of bone: Secondary | ICD-10-CM | POA: Diagnosis present

## 2022-03-26 DIAGNOSIS — F39 Unspecified mood [affective] disorder: Secondary | ICD-10-CM | POA: Diagnosis present

## 2022-03-26 DIAGNOSIS — W010XXA Fall on same level from slipping, tripping and stumbling without subsequent striking against object, initial encounter: Secondary | ICD-10-CM | POA: Diagnosis present

## 2022-03-26 DIAGNOSIS — Z90710 Acquired absence of both cervix and uterus: Secondary | ICD-10-CM | POA: Diagnosis not present

## 2022-03-26 DIAGNOSIS — Z933 Colostomy status: Secondary | ICD-10-CM | POA: Diagnosis not present

## 2022-03-26 DIAGNOSIS — E876 Hypokalemia: Secondary | ICD-10-CM | POA: Diagnosis present

## 2022-03-26 DIAGNOSIS — S42213A Unspecified displaced fracture of surgical neck of unspecified humerus, initial encounter for closed fracture: Secondary | ICD-10-CM

## 2022-03-26 DIAGNOSIS — S72001A Fracture of unspecified part of neck of right femur, initial encounter for closed fracture: Secondary | ICD-10-CM

## 2022-03-26 DIAGNOSIS — Z85118 Personal history of other malignant neoplasm of bronchus and lung: Secondary | ICD-10-CM | POA: Diagnosis not present

## 2022-03-26 DIAGNOSIS — Z28310 Unvaccinated for covid-19: Secondary | ICD-10-CM | POA: Diagnosis not present

## 2022-03-26 DIAGNOSIS — K219 Gastro-esophageal reflux disease without esophagitis: Secondary | ICD-10-CM

## 2022-03-26 DIAGNOSIS — I10 Essential (primary) hypertension: Secondary | ICD-10-CM | POA: Diagnosis present

## 2022-03-26 DIAGNOSIS — Z79899 Other long term (current) drug therapy: Secondary | ICD-10-CM | POA: Diagnosis not present

## 2022-03-26 DIAGNOSIS — Z85038 Personal history of other malignant neoplasm of large intestine: Secondary | ICD-10-CM | POA: Diagnosis not present

## 2022-03-26 DIAGNOSIS — Z888 Allergy status to other drugs, medicaments and biological substances status: Secondary | ICD-10-CM | POA: Diagnosis not present

## 2022-03-26 DIAGNOSIS — S42214A Unspecified nondisplaced fracture of surgical neck of right humerus, initial encounter for closed fracture: Secondary | ICD-10-CM

## 2022-03-26 DIAGNOSIS — S72009A Fracture of unspecified part of neck of unspecified femur, initial encounter for closed fracture: Secondary | ICD-10-CM | POA: Diagnosis present

## 2022-03-26 DIAGNOSIS — D630 Anemia in neoplastic disease: Secondary | ICD-10-CM | POA: Diagnosis present

## 2022-03-26 DIAGNOSIS — Z91041 Radiographic dye allergy status: Secondary | ICD-10-CM | POA: Diagnosis not present

## 2022-03-26 DIAGNOSIS — S72011A Unspecified intracapsular fracture of right femur, initial encounter for closed fracture: Secondary | ICD-10-CM | POA: Diagnosis present

## 2022-03-26 HISTORY — PX: HIP PINNING,CANNULATED: SHX1758

## 2022-03-26 LAB — CBC WITH DIFFERENTIAL/PLATELET
Abs Immature Granulocytes: 0.04 10*3/uL (ref 0.00–0.07)
Abs Immature Granulocytes: 0.04 10*3/uL (ref 0.00–0.07)
Basophils Absolute: 0 10*3/uL (ref 0.0–0.1)
Basophils Absolute: 0 10*3/uL (ref 0.0–0.1)
Basophils Relative: 0 %
Basophils Relative: 1 %
Eosinophils Absolute: 0.3 10*3/uL (ref 0.0–0.5)
Eosinophils Absolute: 0.2 10*3/uL (ref 0.0–0.5)
Eosinophils Relative: 3 %
Eosinophils Relative: 3 %
HCT: 37.3 % (ref 36.0–46.0)
HCT: 40.1 % (ref 36.0–46.0)
Hemoglobin: 12.3 g/dL (ref 12.0–15.0)
Hemoglobin: 13 g/dL (ref 12.0–15.0)
Immature Granulocytes: 1 %
Immature Granulocytes: 1 %
Lymphocytes Relative: 8 %
Lymphocytes Relative: 10 %
Lymphs Abs: 0.6 10*3/uL — ABNORMAL LOW (ref 0.7–4.0)
Lymphs Abs: 0.6 10*3/uL — ABNORMAL LOW (ref 0.7–4.0)
MCH: 28.9 pg (ref 26.0–34.0)
MCH: 28.9 pg (ref 26.0–34.0)
MCHC: 33 g/dL (ref 30.0–36.0)
MCHC: 32.4 g/dL (ref 30.0–36.0)
MCV: 87.8 fL (ref 80.0–100.0)
MCV: 89.1 fL (ref 80.0–100.0)
Monocytes Absolute: 0.8 10*3/uL (ref 0.1–1.0)
Monocytes Absolute: 0.8 10*3/uL (ref 0.1–1.0)
Monocytes Relative: 11 %
Monocytes Relative: 13 %
Neutro Abs: 6.2 10*3/uL (ref 1.7–7.7)
Neutro Abs: 4.7 10*3/uL (ref 1.7–7.7)
Neutrophils Relative %: 77 %
Neutrophils Relative %: 72 %
Platelets: 258 10*3/uL (ref 150–400)
Platelets: 255 10*3/uL (ref 150–400)
RBC: 4.25 MIL/uL (ref 3.87–5.11)
RBC: 4.5 MIL/uL (ref 3.87–5.11)
RDW: 15.2 % (ref 11.5–15.5)
RDW: 15.5 % (ref 11.5–15.5)
WBC: 7.9 10*3/uL (ref 4.0–10.5)
WBC: 6.4 10*3/uL (ref 4.0–10.5)
nRBC: 0 % (ref 0.0–0.2)
nRBC: 0 % (ref 0.0–0.2)

## 2022-03-26 LAB — ABO/RH: ABO/RH(D): O POS

## 2022-03-26 LAB — URINALYSIS, COMPLETE (UACMP) WITH MICROSCOPIC
Bacteria, UA: NONE SEEN
Bilirubin Urine: NEGATIVE
Glucose, UA: NEGATIVE mg/dL
Hgb urine dipstick: NEGATIVE
Ketones, ur: NEGATIVE mg/dL
Nitrite: NEGATIVE
Protein, ur: NEGATIVE mg/dL
Specific Gravity, Urine: 1.004 — ABNORMAL LOW (ref 1.005–1.030)
pH: 7 (ref 5.0–8.0)

## 2022-03-26 LAB — BASIC METABOLIC PANEL
Anion gap: 9 (ref 5–15)
BUN: 13 mg/dL (ref 8–23)
CO2: 24 mmol/L (ref 22–32)
Calcium: 8.8 mg/dL — ABNORMAL LOW (ref 8.9–10.3)
Chloride: 102 mmol/L (ref 98–111)
Creatinine, Ser: 0.67 mg/dL (ref 0.44–1.00)
GFR, Estimated: >60 mL/min (ref >60)
Glucose, Bld: 117 mg/dL — ABNORMAL HIGH (ref 70–99)
Potassium: 3.7 mmol/L (ref 3.5–5.1)
Sodium: 135 mmol/L (ref 135–145)

## 2022-03-26 LAB — CBC
HCT: 33.7 % — ABNORMAL LOW (ref 36.0–46.0)
Hemoglobin: 11.1 g/dL — ABNORMAL LOW (ref 12.0–15.0)
MCH: 28.9 pg (ref 26.0–34.0)
MCHC: 32.9 g/dL (ref 30.0–36.0)
MCV: 87.8 fL (ref 80.0–100.0)
Platelets: 178 10*3/uL (ref 150–400)
RBC: 3.84 MIL/uL — ABNORMAL LOW (ref 3.87–5.11)
RDW: 15.2 % (ref 11.5–15.5)
WBC: 5.3 10*3/uL (ref 4.0–10.5)
nRBC: 0 % (ref 0.0–0.2)

## 2022-03-26 LAB — COMPREHENSIVE METABOLIC PANEL
ALT: 26 U/L (ref 0–44)
AST: 35 U/L (ref 15–41)
Albumin: 3.5 g/dL (ref 3.5–5.0)
Alkaline Phosphatase: 118 U/L (ref 38–126)
Anion gap: 8 (ref 5–15)
BUN: 14 mg/dL (ref 8–23)
CO2: 25 mmol/L (ref 22–32)
Calcium: 9 mg/dL (ref 8.9–10.3)
Chloride: 102 mmol/L (ref 98–111)
Creatinine, Ser: 0.58 mg/dL (ref 0.44–1.00)
GFR, Estimated: >60 mL/min (ref >60)
Glucose, Bld: 122 mg/dL — ABNORMAL HIGH (ref 70–99)
Potassium: 3.9 mmol/L (ref 3.5–5.1)
Sodium: 135 mmol/L (ref 135–145)
Total Bilirubin: 1.1 mg/dL (ref 0.3–1.2)
Total Protein: 6.9 g/dL (ref 6.5–8.1)

## 2022-03-26 LAB — MAGNESIUM: Magnesium: 2 mg/dL (ref 1.7–2.4)

## 2022-03-26 LAB — CREATININE, SERUM
Creatinine, Ser: 0.46 mg/dL (ref 0.44–1.00)
GFR, Estimated: >60 mL/min (ref >60)

## 2022-03-26 LAB — PROTIME-INR
INR: 1.1 (ref 0.8–1.2)
Prothrombin Time: 14.1 seconds (ref 11.4–15.2)

## 2022-03-26 LAB — PREPARE RBC (CROSSMATCH)

## 2022-03-26 SURGERY — FIXATION, FEMUR, NECK, PERCUTANEOUS, USING SCREW
Anesthesia: Spinal | Site: Hip | Laterality: Right

## 2022-03-26 MED ORDER — ACETAMINOPHEN 325 MG PO TABS: 650.0000 mg | ORAL_TABLET | Freq: Four times a day (QID) | ORAL | Status: DC | PRN

## 2022-03-26 MED ORDER — CEFAZOLIN SODIUM-DEXTROSE 2-4 GM/100ML-% IV SOLN
2.0000 g | Freq: Four times a day (QID) | INTRAVENOUS | Status: AC
  Administered 2022-03-26 – 2022-03-27 (×2): 2 g via INTRAVENOUS
  Filled 2022-03-26 (×2): qty 100

## 2022-03-26 MED ORDER — BUPIVACAINE-EPINEPHRINE (PF) 0.5% -1:200000 IJ SOLN
INTRAMUSCULAR | Status: AC
  Filled 2022-03-26: qty 60

## 2022-03-26 MED ORDER — SEVOFLURANE IN SOLN
RESPIRATORY_TRACT | Status: AC
  Filled 2022-03-26: qty 250

## 2022-03-26 MED ORDER — ONDANSETRON HCL 4 MG PO TABS: 4.0000 mg | ORAL_TABLET | Freq: Four times a day (QID) | ORAL | Status: DC | PRN

## 2022-03-26 MED ORDER — OXYCODONE HCL 5 MG/5ML PO SOLN: 5.0000 mg | Freq: Once | ORAL | Status: DC | PRN

## 2022-03-26 MED ORDER — HEPARIN SODIUM (PORCINE) 5000 UNIT/ML IJ SOLN: 5000.0000 [IU] | Freq: Three times a day (TID) | INTRAMUSCULAR | Status: DC

## 2022-03-26 MED ORDER — ACETAMINOPHEN 10 MG/ML IV SOLN
INTRAVENOUS | Status: DC | PRN
  Administered 2022-03-26: 1000 mg via INTRAVENOUS

## 2022-03-26 MED ORDER — PHENYLEPHRINE HCL-NACL 20-0.9 MG/250ML-% IV SOLN
INTRAVENOUS | Status: DC | PRN
  Administered 2022-03-26: 40 ug/min via INTRAVENOUS

## 2022-03-26 MED ORDER — CHLORHEXIDINE GLUCONATE 0.12 % MT SOLN
15.0000 mL | Freq: Once | OROMUCOSAL | Status: AC
  Administered 2022-03-26: 15 mL via OROMUCOSAL

## 2022-03-26 MED ORDER — MORPHINE SULFATE (PF) 2 MG/ML IV SOLN
2.0000 mg | INTRAVENOUS | Status: DC | PRN
  Administered 2022-03-26 – 2022-03-27 (×5): 2 mg via INTRAVENOUS
  Filled 2022-03-26 (×5): qty 1

## 2022-03-26 MED ORDER — POVIDONE-IODINE 10 % EX SWAB
2.0000 | Freq: Once | CUTANEOUS | Status: AC
  Administered 2022-03-26: 2 via TOPICAL

## 2022-03-26 MED ORDER — ONDANSETRON HCL 4 MG/2ML IJ SOLN: 4.0000 mg | Freq: Four times a day (QID) | INTRAMUSCULAR | Status: DC | PRN

## 2022-03-26 MED ORDER — ORAL CARE MOUTH RINSE: 15.0000 mL | Freq: Once | OROMUCOSAL | Status: AC

## 2022-03-26 MED ORDER — CEFAZOLIN SODIUM-DEXTROSE 2-4 GM/100ML-% IV SOLN
INTRAVENOUS | Status: AC
  Filled 2022-03-26: qty 100

## 2022-03-26 MED ORDER — TRANEXAMIC ACID-NACL 1000-0.7 MG/100ML-% IV SOLN
INTRAVENOUS | Status: AC
  Filled 2022-03-26: qty 100

## 2022-03-26 MED ORDER — ACETAMINOPHEN 10 MG/ML IV SOLN
INTRAVENOUS | Status: AC
  Filled 2022-03-26: qty 100

## 2022-03-26 MED ORDER — PROPOFOL 1000 MG/100ML IV EMUL
INTRAVENOUS | Status: AC
  Filled 2022-03-26: qty 100

## 2022-03-26 MED ORDER — PROPOFOL 10 MG/ML IV BOLUS
INTRAVENOUS | Status: DC | PRN
  Administered 2022-03-26: 30 mg via INTRAVENOUS

## 2022-03-26 MED ORDER — MORPHINE SULFATE (PF) 2 MG/ML IV SOLN
0.5000 mg | INTRAVENOUS | Status: DC | PRN
  Administered 2022-03-26: 1 mg via INTRAVENOUS
  Filled 2022-03-26: qty 1

## 2022-03-26 MED ORDER — TRANEXAMIC ACID-NACL 1000-0.7 MG/100ML-% IV SOLN
1000.0000 mg | INTRAVENOUS | Status: AC
  Administered 2022-03-26: 1000 mg via INTRAVENOUS

## 2022-03-26 MED ORDER — ENOXAPARIN SODIUM 30 MG/0.3ML IJ SOSY
30.0000 mg | PREFILLED_SYRINGE | INTRAMUSCULAR | Status: DC
  Administered 2022-03-27: 30 mg via SUBCUTANEOUS
  Filled 2022-03-26: qty 0.3

## 2022-03-26 MED ORDER — ONDANSETRON HCL 4 MG/2ML IJ SOLN: 4.0000 mg | Freq: Once | INTRAMUSCULAR | Status: DC | PRN

## 2022-03-26 MED ORDER — TRAMADOL HCL 50 MG PO TABS: 50.0000 mg | ORAL_TABLET | Freq: Once | ORAL | Status: DC

## 2022-03-26 MED ORDER — BUPIVACAINE IN DEXTROSE 0.75-8.25 % IT SOLN
INTRATHECAL | Status: DC | PRN
  Administered 2022-03-26: 1.6 mL via INTRATHECAL

## 2022-03-26 MED ORDER — FENTANYL CITRATE (PF) 100 MCG/2ML IJ SOLN
INTRAMUSCULAR | Status: AC
  Filled 2022-03-26: qty 2

## 2022-03-26 MED ORDER — ONDANSETRON HCL 4 MG/2ML IJ SOLN
INTRAMUSCULAR | Status: AC
  Filled 2022-03-26: qty 2

## 2022-03-26 MED ORDER — CHLORHEXIDINE GLUCONATE 4 % EX LIQD
60.0000 mL | Freq: Once | CUTANEOUS | Status: DC
  Filled 2022-03-26: qty 60

## 2022-03-26 MED ORDER — AMLODIPINE BESYLATE 5 MG PO TABS
5.0000 mg | ORAL_TABLET | Freq: Every day | ORAL | Status: DC
  Administered 2022-03-27 – 2022-03-30 (×4): 5 mg via ORAL
  Filled 2022-03-26 (×5): qty 1

## 2022-03-26 MED ORDER — LACTATED RINGERS IV SOLN: INTRAVENOUS | Status: DC

## 2022-03-26 MED ORDER — ACETAMINOPHEN 10 MG/ML IV SOLN
1000.0000 mg | Freq: Four times a day (QID) | INTRAVENOUS | Status: AC
  Administered 2022-03-26 – 2022-03-27 (×4): 1000 mg via INTRAVENOUS
  Filled 2022-03-26 (×5): qty 100

## 2022-03-26 MED ORDER — OXYCODONE HCL 5 MG PO TABS: 5.0000 mg | ORAL_TABLET | ORAL | Status: DC | PRN

## 2022-03-26 MED ORDER — 0.9 % SODIUM CHLORIDE (POUR BTL) OPTIME
TOPICAL | Status: DC | PRN
  Administered 2022-03-26: 1000 mL

## 2022-03-26 MED ORDER — PROPOFOL 500 MG/50ML IV EMUL
INTRAVENOUS | Status: DC | PRN
  Administered 2022-03-26: 50 ug/kg/min via INTRAVENOUS

## 2022-03-26 MED ORDER — PANTOPRAZOLE SODIUM 40 MG PO TBEC
40.0000 mg | DELAYED_RELEASE_TABLET | Freq: Every day | ORAL | Status: DC
  Administered 2022-03-27 – 2022-03-30 (×4): 40 mg via ORAL
  Filled 2022-03-26 (×5): qty 1

## 2022-03-26 MED ORDER — METOCLOPRAMIDE HCL 10 MG PO TABS: 5.0000 mg | ORAL_TABLET | Freq: Three times a day (TID) | ORAL | Status: DC | PRN

## 2022-03-26 MED ORDER — LIDOCAINE HCL (PF) 2 % IJ SOLN
INTRAMUSCULAR | Status: AC
  Filled 2022-03-26: qty 5

## 2022-03-26 MED ORDER — BUPIVACAINE HCL (PF) 0.5 % IJ SOLN
INTRAMUSCULAR | Status: AC
  Filled 2022-03-26: qty 30

## 2022-03-26 MED ORDER — ACETAMINOPHEN 10 MG/ML IV SOLN: 1000.0000 mg | Freq: Four times a day (QID) | INTRAVENOUS | Status: DC

## 2022-03-26 MED ORDER — BUPIVACAINE-EPINEPHRINE (PF) 0.5% -1:200000 IJ SOLN
INTRAMUSCULAR | Status: DC | PRN
  Administered 2022-03-26: 30 mL via PERINEURAL

## 2022-03-26 MED ORDER — ONDANSETRON HCL 4 MG/2ML IJ SOLN
4.0000 mg | Freq: Four times a day (QID) | INTRAMUSCULAR | Status: DC | PRN
  Administered 2022-03-26 – 2022-03-27 (×2): 4 mg via INTRAVENOUS
  Filled 2022-03-26 (×2): qty 2

## 2022-03-26 MED ORDER — ROCURONIUM BROMIDE 10 MG/ML (PF) SYRINGE
PREFILLED_SYRINGE | INTRAVENOUS | Status: AC
  Filled 2022-03-26: qty 10

## 2022-03-26 MED ORDER — OXYCODONE HCL 5 MG PO TABS: 5.0000 mg | ORAL_TABLET | Freq: Once | ORAL | Status: DC | PRN

## 2022-03-26 MED ORDER — METHOCARBAMOL 500 MG PO TABS: 500.0000 mg | ORAL_TABLET | Freq: Four times a day (QID) | ORAL | Status: DC | PRN

## 2022-03-26 MED ORDER — METOCLOPRAMIDE HCL 5 MG/ML IJ SOLN
5.0000 mg | Freq: Three times a day (TID) | INTRAMUSCULAR | Status: DC | PRN
  Administered 2022-03-27: 10 mg via INTRAVENOUS
  Filled 2022-03-26: qty 2

## 2022-03-26 MED ORDER — TRANEXAMIC ACID-NACL 1000-0.7 MG/100ML-% IV SOLN
1000.0000 mg | Freq: Once | INTRAVENOUS | Status: AC
  Administered 2022-03-26: 1000 mg via INTRAVENOUS
  Filled 2022-03-26: qty 100

## 2022-03-26 MED ORDER — SODIUM CHLORIDE 0.9 % IV SOLN: INTRAVENOUS | Status: DC

## 2022-03-26 MED ORDER — HYDROCODONE-ACETAMINOPHEN 7.5-325 MG PO TABS
1.0000 | ORAL_TABLET | ORAL | Status: DC | PRN
  Filled 2022-03-26: qty 1

## 2022-03-26 MED ORDER — HYDROMORPHONE HCL 1 MG/ML IJ SOLN
0.2500 mg | INTRAMUSCULAR | Status: DC | PRN
  Administered 2022-03-26: 0.5 mg via INTRAVENOUS
  Filled 2022-03-26: qty 0.5

## 2022-03-26 MED ORDER — CEFAZOLIN SODIUM-DEXTROSE 2-4 GM/100ML-% IV SOLN
2.0000 g | INTRAVENOUS | Status: AC
  Administered 2022-03-26: 2 g via INTRAVENOUS

## 2022-03-26 MED ORDER — ATENOLOL 25 MG PO TABS
50.0000 mg | ORAL_TABLET | Freq: Every day | ORAL | Status: DC
  Administered 2022-03-27 – 2022-03-30 (×4): 50 mg via ORAL
  Filled 2022-03-26 (×5): qty 2

## 2022-03-26 MED ORDER — ESCITALOPRAM OXALATE 10 MG PO TABS
20.0000 mg | ORAL_TABLET | Freq: Every day | ORAL | Status: DC
  Administered 2022-03-27 – 2022-03-30 (×4): 20 mg via ORAL
  Filled 2022-03-26 (×5): qty 2

## 2022-03-26 MED ORDER — DOCUSATE SODIUM 100 MG PO CAPS
100.0000 mg | ORAL_CAPSULE | Freq: Two times a day (BID) | ORAL | Status: DC
  Administered 2022-03-27 – 2022-03-30 (×7): 100 mg via ORAL
  Filled 2022-03-26 (×8): qty 1

## 2022-03-26 MED ORDER — METHOCARBAMOL 1000 MG/10ML IJ SOLN: 500.0000 mg | Freq: Four times a day (QID) | INTRAVENOUS | Status: DC | PRN

## 2022-03-26 MED ORDER — ACETAMINOPHEN 650 MG RE SUPP: 650.0000 mg | Freq: Four times a day (QID) | RECTAL | Status: DC | PRN

## 2022-03-26 SURGICAL SUPPLY — 48 items
BAG HAMPER (MISCELLANEOUS) ×1 IMPLANT
BIT DRILL 4.8X300 (BIT) ×1 IMPLANT
BLADE HEX COATED 2.75 (ELECTRODE) ×1 IMPLANT
BLADE SURG SZ10 CARB STEEL (BLADE) ×2 IMPLANT
BNDG GAUZE ELAST 4 BULKY (GAUZE/BANDAGES/DRESSINGS) ×1 IMPLANT
CHLORAPREP W/TINT 26 (MISCELLANEOUS) ×1 IMPLANT
CLOTH BEACON ORANGE TIMEOUT ST (SAFETY) ×1 IMPLANT
COVER LIGHT HANDLE STERIS (MISCELLANEOUS) ×2 IMPLANT
COVER PERINEAL POST (MISCELLANEOUS) ×1 IMPLANT
DRAPE STERI IOBAN 125X83 (DRAPES) ×1 IMPLANT
DRSG MEPILEX FLEX 6X6 (GAUZE/BANDAGES/DRESSINGS) IMPLANT
DRSG MEPILEX SACRM 8.7X9.8 (GAUZE/BANDAGES/DRESSINGS) ×1 IMPLANT
GLOVE BIO SURGEON STRL SZ7 (GLOVE) IMPLANT
GLOVE BIOGEL PI IND STRL 7.0 (GLOVE) ×2 IMPLANT
GLOVE SS N UNI LF 8.5 STRL (GLOVE) ×1 IMPLANT
GLOVE SURG POLYISO LF SZ8 (GLOVE) ×2 IMPLANT
GOWN STRL REUS W/TWL LRG LVL3 (GOWN DISPOSABLE) ×1 IMPLANT
GOWN STRL REUS W/TWL XL LVL3 (GOWN DISPOSABLE) ×1 IMPLANT
INST SET MAJOR BONE (KITS) ×1 IMPLANT
KIT BLADEGUARD II DBL (SET/KITS/TRAYS/PACK) ×1 IMPLANT
KIT TURNOVER CYSTO (KITS) ×1 IMPLANT
MANIFOLD NEPTUNE II (INSTRUMENTS) ×1 IMPLANT
MARKER SKIN DUAL TIP RULER LAB (MISCELLANEOUS) ×1 IMPLANT
NDL HYPO 21X1.5 SAFETY (NEEDLE) ×1 IMPLANT
NDL SPNL 18GX3.5 QUINCKE PK (NEEDLE) ×1 IMPLANT
NEEDLE HYPO 21X1.5 SAFETY (NEEDLE) ×1 IMPLANT
NEEDLE SPNL 18GX3.5 QUINCKE PK (NEEDLE) ×1 IMPLANT
NS IRRIG 1000ML POUR BTL (IV SOLUTION) ×1 IMPLANT
PACK BASIC III (CUSTOM PROCEDURE TRAY) ×1
PACK SRG BSC III STRL LF ECLPS (CUSTOM PROCEDURE TRAY) ×1 IMPLANT
PAD ABD 5X9 TENDERSORB (GAUZE/BANDAGES/DRESSINGS) ×1 IMPLANT
PENCIL SMOKE EVACUATOR COATED (MISCELLANEOUS) ×1 IMPLANT
PIN GUIDE DRILL TIP 2.8X300 (DRILL) ×3 IMPLANT
SCREW CANN 6.5 75MM (Screw) ×3 IMPLANT
SCREW CANN LG 6.5 FLT 75X22 (Screw) IMPLANT
SET BASIN LINEN APH (SET/KITS/TRAYS/PACK) ×1 IMPLANT
SPONGE T-LAP 18X18 ~~LOC~~+RFID (SPONGE) ×2 IMPLANT
STAPLER VISISTAT 35W (STAPLE) IMPLANT
SUT BRALON NAB BRD #1 30IN (SUTURE) ×1 IMPLANT
SUT MNCRL 0 VIOLET CTX 36 (SUTURE) ×1 IMPLANT
SUT MON AB 0 CT1 (SUTURE) IMPLANT
SUT MONOCRYL 0 CTX 36 (SUTURE) ×1
SYR 30ML LL (SYRINGE) ×1 IMPLANT
SYR BULB IRRIG 60ML STRL (SYRINGE) ×2 IMPLANT
TRAY FOLEY W/BAG SLVR 16FR (SET/KITS/TRAYS/PACK) ×1
TRAY FOLEY W/BAG SLVR 16FR ST (SET/KITS/TRAYS/PACK) ×1 IMPLANT
WASHER FLAT 6.5MM (Washer) IMPLANT
YANKAUER SUCT BULB TIP 10FT TU (MISCELLANEOUS) ×1 IMPLANT

## 2022-03-26 NOTE — ED Notes (Signed)
Dr Braulio Conte at bedside

## 2022-03-26 NOTE — Anesthesia Procedure Notes (Signed)
Date/Time: 03/26/2022 12:19 PM  Performed by: Maude Leriche, CRNAPre-anesthesia Checklist: Patient identified, Emergency Drugs available, Suction available, Patient being monitored and Timeout performed Oxygen Delivery Method: Nasal cannula Dental Injury: Teeth and Oropharynx as per pre-operative assessment

## 2022-03-26 NOTE — Brief Op Note (Signed)
03/26/2022  8:29 PM  PATIENT:  Anthony Sar  71 y.o. female  PRE-OPERATIVE DIAGNOSIS:  right hip fracture  POST-OPERATIVE DIAGNOSIS:  right hip fracture  PROCEDURE:  Procedure(s): INTERNAL FIXATION RIGHT HIP (Right)  Right femoral neck fracture subcapital nondisplaced  Implants Biomet titanium screws x3 all 75 mm with a washer  No assistance  Spinal anesthesia  10 cc estimated blood loss  No blood administered  No drains  Local medication Marcaine with epinephrine 30 cc  SURGEON:  Surgeon(s) and Role:    Carole Civil, MD - Primary  No specimen  Counts and needle counts and sponges correct  No tourniquet  Dragon Dictation  PLAN OF CARE: Admit to inpatient   PATIENT DISPOSITION:  PACU - hemodynamically stable.   Delay start of Pharmacological VTE agent (>24hrs) due to surgical blood loss or risk of bleeding: yes

## 2022-03-26 NOTE — Anesthesia Procedure Notes (Addendum)
Spinal  Patient location during procedure: OR Start time: 03/26/2022 12:25 PM End time: 03/26/2022 12:36 PM Staffing Performed: anesthesiologist  Anesthesiologist: Louann Sjogren, MD Performed by: Maude Leriche, CRNA Authorized by: Louann Sjogren, MD   Preanesthetic Checklist Completed: patient identified, IV checked, site marked, risks and benefits discussed, surgical consent, monitors and equipment checked, pre-op evaluation and timeout performed Spinal Block Patient position: right lateral decubitus Prep: ChloraPrep Patient monitoring: heart rate, cardiac monitor, continuous pulse ox and blood pressure Approach: midline Location: L3-4 Injection technique: single-shot Needle Needle type: Spinocan  Needle gauge: 22 G Needle length: 9 cm Needle insertion depth: 5 cm Assessment Sensory level: T10 Events: second provider Additional Notes 1st attempt by CRNA w/heme aspirated midline at L3-L4

## 2022-03-26 NOTE — ED Notes (Signed)
Pt changed into gown, instructed to continue NPO status. Pt sling readjusted at this time.

## 2022-03-26 NOTE — Progress Notes (Signed)
Brief same day progress note:  Patient is a 71 year old female with history of bone cancer, lung cancer, colon cancer status post colostomy placement, hypertension who presents from home after a fall.  She lives with her family.  She usually walks fine.  She was walking outside with her dog when he pulled on the leash and she lost her balance and fell.  Immediately felt sharp pain on her right side. Patient seen and examined the bedside this morning the emergency department.  She is hemodynamically stable.  Pain well controlled.  Orthopedics saw her.   Assessment and plan:  Right hip fracture: CT showed nondisplaced subcapital right femur fracture with fracture line extending into the greater trochanter and lesser trochanters.  Continue pain management, supportive care.  Orthopedics following, plan for ORIF.  PT/OT will be consulted after surgery.  Chemical DVT prophylaxis after that  Shoulder fracture: Imaging showed subtle nondisplaced fracture of the lateral cortex of the humeral neck.  Management as per orthopedics   History of lung/bone cancer: Currently on chemo and radiation.  Follows with oncology.  CT of right shoulder showed large destructive soft tissue mass at the right sternoclavicular junction compatible with metastasis.  Colon cancer: Likely in remission.  Status post colostomy  GERD: Continue Protonix  Mood disorder: Continue Lexapro  Hypertension: Continue amlodipine, atenolol

## 2022-03-26 NOTE — Assessment & Plan Note (Signed)
-   Continue pain control with Tylenol, oxycodone, morphine - Ortho consulted - Plan for surgery tomorrow - Type and screen, UA, n.p.o. - Continue to monitor

## 2022-03-26 NOTE — Assessment & Plan Note (Signed)
Continue amlodipine and atenolol

## 2022-03-26 NOTE — ED Notes (Signed)
Telephone report given to pre-op team

## 2022-03-26 NOTE — Interval H&P Note (Signed)
History and Physical Interval Note:  03/26/2022 12:08 PM  Caitlin Mckinney  has presented today for surgery, with the diagnosis of right hip fracture.  The various methods of treatment have been discussed with the patient and family. After consideration of risks, benefits and other options for treatment, the patient has consented to  Procedure(s): INTERNAL FIXATION RIGHT HIP (Right) as a surgical intervention.  The patient's history has been reviewed, patient examined, no change in status, stable for surgery.  I have reviewed the patient's chart and labs.  Questions were answered to the patient's satisfaction.     Arther Abbott

## 2022-03-26 NOTE — H&P (View-Only) (Signed)
[place]   Patient ID: Caitlin Mckinney, female   DOB: 12/28/1950, 71 y.o.   MRN: [id number]  New patient  Requested by:  Reason for:   Based on the information below I recommend cannulated screw fixation right hip*  Chief Complaint  Patient presents with   Fall     HPI  71 year old female with right hip fracture right hip pain and pain in the proximal humerus from a fall.  Patient was walking her dog and tripped and fell Location pain located right hip right shoulder Duration date of injury 03/25/2022 Severity moderate Quality dull Modified by increased movement  Review of Systems (all) Review of Systems  Constitutional:  Positive for malaise/fatigue and weight loss.  Gastrointestinal:        Colostomy bag  Musculoskeletal:  Positive for falls and joint pain.  All other systems reviewed and are negative.   Past Medical History:  Diagnosis Date   Cancer [facility])    endometrial and colon 2010   Hypertension     Past Surgical History:  Procedure Laterality Date   ABDOMINAL HYSTERECTOMY     APPENDECTOMY     COLON RESECTION     COLON SURGERY     COLOSTOMY     COLOSTOMY REVERSAL     SHOULDER ARTHROSCOPY     TUBAL LIGATION      History reviewed. No pertinent family history. Social History   Tobacco Use   Smoking status: Former   Smokeless tobacco: Never  Substance Use Topics   Alcohol use: No   Drug use: No   Allergies  Allergen Reactions   Contrast Media [Iodinated Contrast Media] Shortness Of Breath   Other     States her blood pressure medicine made her cough    Allevess [Capsaicin-Menthol] Rash   Erythromycin Rash   Naproxen Rash    Current Facility-Administered Medications:    acetaminophen (TYLENOL) tablet 650 mg, 650 mg, Oral, Q6H PRN **OR** acetaminophen (TYLENOL) suppository 650 mg, 650 mg, Rectal, Q6H PRN, Zierle-Ghosh, Asia B, DO   amLODipine (NORVASC) tablet 5 mg, 5 mg, Oral, Daily, Zierle-Ghosh, Asia B,  DO   atenolol (TENORMIN) tablet 50 mg, 50 mg, Oral, Daily, Zierle-Ghosh, Asia B, DO   escitalopram (LEXAPRO) tablet 20 mg, 20 mg, Oral, Daily, Zierle-Ghosh, Asia B, DO   heparin injection 5,000 Units, 5,000 Units, Subcutaneous, Q8H, Zierle-Ghosh, Asia B, DO   morphine (PF) 2 MG/ML injection 2 mg, 2 mg, Intravenous, Q2H PRN, Zierle-Ghosh, Asia B, DO, 2 mg at 03/26/22 0808   ondansetron (ZOFRAN) tablet 4 mg, 4 mg, Oral, Q6H PRN **OR** ondansetron (ZOFRAN) injection 4 mg, 4 mg, Intravenous, Q6H PRN, Zierle-Ghosh, Asia B, DO   oxyCODONE (Oxy IR/ROXICODONE) immediate release tablet 5 mg, 5 mg, Oral, Q4H PRN, Zierle-Ghosh, Asia B, DO   pantoprazole (PROTONIX) EC tablet 40 mg, 40 mg, Oral, Daily, Zierle-Ghosh, Asia B, DO  Current Outpatient Medications:    amLODipine (NORVASC) 5 MG tablet, Take 5 mg by mouth daily., Disp: , Rfl:    atenolol (TENORMIN) 50 MG tablet, Take 50 mg by mouth daily., Disp: , Rfl:    docusate sodium (COLACE) 100 MG capsule, Take 1 capsule (100 mg total) by mouth every 12 (twelve) hours., Disp: 60 capsule, Rfl: 0   escitalopram (LEXAPRO) 20 MG tablet, Take 20 mg by mouth daily., Disp: , Rfl:    fentaNYL (DURAGESIC) 25 MCG/HR, Place 1 patch onto the skin every 3 (three) days., Disp: , Rfl:  fluticasone (FLONASE) 50 MCG/ACT nasal spray, Place 1 spray into both nostrils daily as needed for allergies or rhinitis., Disp: , Rfl:    HYDROmorphone (DILAUDID) 2 MG tablet, Take 2 mg by mouth every 3 (three) hours as needed for moderate pain., Disp: , Rfl:    Hypromellose (ARTIFICIAL TEARS) 0.4 % SOLN, Apply 2 drops to eye 3 (three) times daily., Disp: , Rfl:    loratadine (CLARITIN) 10 MG tablet, Take 10 mg by mouth daily., Disp: , Rfl:    Magnesium 250 MG TABS, Take 250-500 mg by mouth at bedtime., Disp: , Rfl:    omeprazole (PRILOSEC) 20 MG capsule, Take 1 capsule by mouth daily., Disp: , Rfl:    polyethylene glycol (MIRALAX / GLYCOLAX) 17 g packet, Take 17 g by mouth 2 (two) times  daily as needed., Disp: 14 each, Rfl: 0    Physical Exam(=30) BP 117/66   Pulse 69   Temp 98 F (36.7 C) (Oral)   Resp 18   Ht 5' (1.524 m)   Wt 56.2 kg   SpO2 100%   BMI 24.20 kg/m   Gen. Appearance no congenital abnormalities Peripheral vascular system no edema normal temperature and color good capillary refill excellent pulses Lymph nodes no lymph nodes are palpated in the groin Gait unable to ambulate  Left Upper extremity  Inspection revealed no malalignment or asymmetry  Assessment of range of motion: Full range of motion was recorded  Assessment of stability: Elbow wrist and hand and shoulder were stable  Assessment of muscle strength and tone revealed grade 5 muscle strength and normal muscle tone  Skin was normal without rash lesion or ulceration  Right upper extremity No erythema in the skin of the right shoulder or arm She has decreased active and passive range of motion secondary to pain Shoulder is reduced stable elbows normal hand and wrist are also normal Muscle strength and muscle tone are normal Tenderness over the proximal humerus is noted  Right Lower extremity  Inspection revealed no malalignment or asymmetry  Assessment of range of motion: Deferred because of pain  assessment of stability: No subluxations of the joints of the right leg  assessment of muscle s tone revealed normal muscle tone  skin was normal without rash lesion or ulceration  Left lower extremity Inspection revealed no malalignment or asymmetry Assessment of range of motion: Full range of motion was recorded Assessment of stability: Ankle, knee and hip were stable Assessment of muscle strength and tone revealed grade 5 muscle strength and normal muscle tone Skin was normal without rash lesion or ulceration  Coordination was tested by finger-to-nose nose and was normal Deep tendon reflexes were 2+ in the upper extremities lower extremity deferred because of fracture hip   examination of sensation by touch was normal  Mental status  Oriented to time person and place normal  Mood and affect normal without depression anxiety or agitation  Dx:   Data Reviewed  ER RECORD REVIEWED: CONFIRMS HISTORY      Latest Ref Rng & Units 03/26/2022    3:36 AM 03/26/2022    1:01 AM 02/19/2022    9:43 PM  CBC  WBC 4.0 - 10.5 K/uL 6.4  7.9  6.9   Hemoglobin 12.0 - 15.0 g/dL 13.0  12.3  12.1   Hematocrit 36.0 - 46.0 % 40.1  37.3  37.3   Platelets 150 - 400 K/uL 255  258  319       Latest Ref Rng & Units  03/26/2022    3:36 AM 03/26/2022    1:01 AM 02/19/2022    9:43 PM  BMP  Glucose 70 - 99 mg/dL 122  117  102   BUN 8 - 23 mg/dL 14  13  15    Creatinine 0.44 - 1.00 mg/dL 0.58  0.67  0.47   Sodium 135 - 145 mmol/L 135  135  133   Potassium 3.5 - 5.1 mmol/L 3.9  3.7  3.6   Chloride 98 - 111 mmol/L 102  102  101   CO2 22 - 32 mmol/L 25  24  27    Calcium 8.9 - 10.3 mg/dL 9.0  8.8  8.5     CT scan of the shoulder and x-rays of the shoulder were reviewed.  I see a nondisplaced proximal humerus fracture  Plain images of the hip and CT scan of the hip confirm subcapital femoral neck fracture  There are no evidence of metastatic lesions    Assessment  71 year old female with metastatic cancer she also has a colostomy bag presents with a fracture proximal humerus and right hip    Plan   The patient will require open reduction internal fixation of the right hip with percutaneous screws.  Patient is advised that there is a 10% chance of nonunion or avascular necrosis  Please be advised:  You are on a narcotic pain medication to control your pain. Medications such as hydrocodone, Norco, oxycodone, OxyContin,  Dilaudid and similar medications have been associated with dependence and addiction. Please work with me to try to eliminate these medications as soon as possible to avoid complications of dependence and addiction.  Only take these medications as prescribed  and for the problem they were prescribed for. Once your injury has healed please wean yourself from these medications. Once your surgery has been completed please start taking as little of this medication as possible.  If you have a problem taking yourself off of these medications please discuss with me so that together we can avoid serious complications which include dependence, addiction and death.  She also has some increased risk of bleeding from her chemotherapy infusions   Carole Civil MD

## 2022-03-26 NOTE — Assessment & Plan Note (Signed)
-   Ortho consulted - Continue pain control

## 2022-03-26 NOTE — Assessment & Plan Note (Signed)
Continue Protonix °

## 2022-03-26 NOTE — Consult Note (Signed)
[place]   Patient ID: Caitlin Mckinney, female   DOB: 05-16-1951, 71 y.o.   MRN: [id number]  New patient  Requested by:  Reason for:   Based on the information below I recommend cannulated screw fixation right hip*  Chief Complaint  Patient presents with   Fall     HPI  71 year old female with right hip fracture right hip pain and pain in the proximal humerus from a fall.  Patient was walking her dog and tripped and fell Location pain located right hip right shoulder Duration date of injury 03/25/2022 Severity moderate Quality dull Modified by increased movement  Review of Systems (all) Review of Systems  Constitutional:  Positive for malaise/fatigue and weight loss.  Gastrointestinal:        Colostomy bag  Musculoskeletal:  Positive for falls and joint pain.  All other systems reviewed and are negative.   Past Medical History:  Diagnosis Date   Cancer [facility])    endometrial and colon 2010   Hypertension     Past Surgical History:  Procedure Laterality Date   ABDOMINAL HYSTERECTOMY     APPENDECTOMY     COLON RESECTION     COLON SURGERY     COLOSTOMY     COLOSTOMY REVERSAL     SHOULDER ARTHROSCOPY     TUBAL LIGATION      History reviewed. No pertinent family history. Social History   Tobacco Use   Smoking status: Former   Smokeless tobacco: Never  Substance Use Topics   Alcohol use: No   Drug use: No   Allergies  Allergen Reactions   Contrast Media [Iodinated Contrast Media] Shortness Of Breath   Other     States her blood pressure medicine made her cough    Allevess [Capsaicin-Menthol] Rash   Erythromycin Rash   Naproxen Rash    Current Facility-Administered Medications:    acetaminophen (TYLENOL) tablet 650 mg, 650 mg, Oral, Q6H PRN **OR** acetaminophen (TYLENOL) suppository 650 mg, 650 mg, Rectal, Q6H PRN, Zierle-Ghosh, Asia B, DO   amLODipine (NORVASC) tablet 5 mg, 5 mg, Oral, Daily, Zierle-Ghosh, Asia B,  DO   atenolol (TENORMIN) tablet 50 mg, 50 mg, Oral, Daily, Zierle-Ghosh, Asia B, DO   escitalopram (LEXAPRO) tablet 20 mg, 20 mg, Oral, Daily, Zierle-Ghosh, Asia B, DO   heparin injection 5,000 Units, 5,000 Units, Subcutaneous, Q8H, Zierle-Ghosh, Asia B, DO   morphine (PF) 2 MG/ML injection 2 mg, 2 mg, Intravenous, Q2H PRN, Zierle-Ghosh, Asia B, DO, 2 mg at 03/26/22 0808   ondansetron (ZOFRAN) tablet 4 mg, 4 mg, Oral, Q6H PRN **OR** ondansetron (ZOFRAN) injection 4 mg, 4 mg, Intravenous, Q6H PRN, Zierle-Ghosh, Asia B, DO   oxyCODONE (Oxy IR/ROXICODONE) immediate release tablet 5 mg, 5 mg, Oral, Q4H PRN, Zierle-Ghosh, Asia B, DO   pantoprazole (PROTONIX) EC tablet 40 mg, 40 mg, Oral, Daily, Zierle-Ghosh, Asia B, DO  Current Outpatient Medications:    amLODipine (NORVASC) 5 MG tablet, Take 5 mg by mouth daily., Disp: , Rfl:    atenolol (TENORMIN) 50 MG tablet, Take 50 mg by mouth daily., Disp: , Rfl:    docusate sodium (COLACE) 100 MG capsule, Take 1 capsule (100 mg total) by mouth every 12 (twelve) hours., Disp: 60 capsule, Rfl: 0   escitalopram (LEXAPRO) 20 MG tablet, Take 20 mg by mouth daily., Disp: , Rfl:    fentaNYL (DURAGESIC) 25 MCG/HR, Place 1 patch onto the skin every 3 (three) days., Disp: , Rfl:  fluticasone (FLONASE) 50 MCG/ACT nasal spray, Place 1 spray into both nostrils daily as needed for allergies or rhinitis., Disp: , Rfl:    HYDROmorphone (DILAUDID) 2 MG tablet, Take 2 mg by mouth every 3 (three) hours as needed for moderate pain., Disp: , Rfl:    Hypromellose (ARTIFICIAL TEARS) 0.4 % SOLN, Apply 2 drops to eye 3 (three) times daily., Disp: , Rfl:    loratadine (CLARITIN) 10 MG tablet, Take 10 mg by mouth daily., Disp: , Rfl:    Magnesium 250 MG TABS, Take 250-500 mg by mouth at bedtime., Disp: , Rfl:    omeprazole (PRILOSEC) 20 MG capsule, Take 1 capsule by mouth daily., Disp: , Rfl:    polyethylene glycol (MIRALAX / GLYCOLAX) 17 g packet, Take 17 g by mouth 2 (two) times  daily as needed., Disp: 14 each, Rfl: 0    Physical Exam(=30) BP 117/66   Pulse 69   Temp 98 F (36.7 C) (Oral)   Resp 18   Ht 5' (1.524 m)   Wt 56.2 kg   SpO2 100%   BMI 24.20 kg/m   Gen. Appearance no congenital abnormalities Peripheral vascular system no edema normal temperature and color good capillary refill excellent pulses Lymph nodes no lymph nodes are palpated in the groin Gait unable to ambulate  Left Upper extremity  Inspection revealed no malalignment or asymmetry  Assessment of range of motion: Full range of motion was recorded  Assessment of stability: Elbow wrist and hand and shoulder were stable  Assessment of muscle strength and tone revealed grade 5 muscle strength and normal muscle tone  Skin was normal without rash lesion or ulceration  Right upper extremity No erythema in the skin of the right shoulder or arm She has decreased active and passive range of motion secondary to pain Shoulder is reduced stable elbows normal hand and wrist are also normal Muscle strength and muscle tone are normal Tenderness over the proximal humerus is noted  Right Lower extremity  Inspection revealed no malalignment or asymmetry  Assessment of range of motion: Deferred because of pain  assessment of stability: No subluxations of the joints of the right leg  assessment of muscle s tone revealed normal muscle tone  skin was normal without rash lesion or ulceration  Left lower extremity Inspection revealed no malalignment or asymmetry Assessment of range of motion: Full range of motion was recorded Assessment of stability: Ankle, knee and hip were stable Assessment of muscle strength and tone revealed grade 5 muscle strength and normal muscle tone Skin was normal without rash lesion or ulceration  Coordination was tested by finger-to-nose nose and was normal Deep tendon reflexes were 2+ in the upper extremities lower extremity deferred because of fracture hip   examination of sensation by touch was normal  Mental status  Oriented to time person and place normal  Mood and affect normal without depression anxiety or agitation  Dx:   Data Reviewed  ER RECORD REVIEWED: CONFIRMS HISTORY      Latest Ref Rng & Units 03/26/2022    3:36 AM 03/26/2022    1:01 AM 02/19/2022    9:43 PM  CBC  WBC 4.0 - 10.5 K/uL 6.4  7.9  6.9   Hemoglobin 12.0 - 15.0 g/dL 13.0  12.3  12.1   Hematocrit 36.0 - 46.0 % 40.1  37.3  37.3   Platelets 150 - 400 K/uL 255  258  319       Latest Ref Rng & Units  03/26/2022    3:36 AM 03/26/2022    1:01 AM 02/19/2022    9:43 PM  BMP  Glucose 70 - 99 mg/dL 122  117  102   BUN 8 - 23 mg/dL 14  13  15    Creatinine 0.44 - 1.00 mg/dL 0.58  0.67  0.47   Sodium 135 - 145 mmol/L 135  135  133   Potassium 3.5 - 5.1 mmol/L 3.9  3.7  3.6   Chloride 98 - 111 mmol/L 102  102  101   CO2 22 - 32 mmol/L 25  24  27    Calcium 8.9 - 10.3 mg/dL 9.0  8.8  8.5     CT scan of the shoulder and x-rays of the shoulder were reviewed.  I see a nondisplaced proximal humerus fracture  Plain images of the hip and CT scan of the hip confirm subcapital femoral neck fracture  There are no evidence of metastatic lesions    Assessment  71 year old female with metastatic cancer she also has a colostomy bag presents with a fracture proximal humerus and right hip    Plan   The patient will require open reduction internal fixation of the right hip with percutaneous screws.  Patient is advised that there is a 10% chance of nonunion or avascular necrosis  Please be advised:  You are on a narcotic pain medication to control your pain. Medications such as hydrocodone, Norco, oxycodone, OxyContin,  Dilaudid and similar medications have been associated with dependence and addiction. Please work with me to try to eliminate these medications as soon as possible to avoid complications of dependence and addiction.  Only take these medications as prescribed  and for the problem they were prescribed for. Once your injury has healed please wean yourself from these medications. Once your surgery has been completed please start taking as little of this medication as possible.  If you have a problem taking yourself off of these medications please discuss with me so that together we can avoid serious complications which include dependence, addiction and death.  She also has some increased risk of bleeding from her chemotherapy infusions   Carole Civil MD

## 2022-03-26 NOTE — Op Note (Signed)
/  28/2023  8:29 PM  PATIENT:  Caitlin Mckinney  71 y.o. female  PRE-OPERATIVE DIAGNOSIS:  right hip fracture  POST-OPERATIVE DIAGNOSIS:  right hip fracture  PROCEDURE:  Procedure(s): INTERNAL FIXATION RIGHT HIP (Right)  Right femoral neck fracture subcapital nondisplaced  Implants Biomet titanium screws x3 all 75 mm with a washer  Details of procedure  The patient was seen in the preop area and confirmed to be [name].  Her right hip was marked as the surgical site and the patient was taken to the operating room for spinal anesthetic.  A Foley catheter was then inserted.  The patient was placed on the fracture table with a padded perineal post the left leg was abducted the right leg was placed in traction.  After internally rotating the leg slightly the C-arm was brought in for a preop images  The fracture was nondisplaced.  The leg was prepped and draped sterilely.  We did place plastic drape to drape off the colostomy bag which was on the left side.  After confirming the timeout we proceeded as follows:  The incision was made at the flare of the greater trochanter divided sharply through skin and subcutaneous tissue down to fascia.  The fascia was split in line with the skin incision.  An L-shaped incision was made at the greater trochanter and carried distally on the posterior aspect of the vastus lateralis.  Subperiosteal dissection exposed the proximal femur.  A triangular pin configuration was chosen and a K wire was placed in the center of the femoral head at the inferior portion of the neck.  Once this was confirmed to be in good position 2 additional K wires were placed with the C arm in the lateral position to obtain anterior and posterior screws  These were measured overdrilled cortex only and then a power drill was used to place 3, 75 mm fully threaded screws with washers  The wound was irrigated hemostasis was maintained.  Layered closure was performed by repairing  the vastus lateralis to its anatomic position with 0 Monocryl suture in interrupted fashion.  This was followed by closure of the fascial layer with #1 Braylon in the subcu with 0 Monocryl followed by staples.  Prior to fascial closure we injected the submuscular tissue with Marcaine with epinephrine a total of 30 cc  The wound was covered with a sterile bandage  The postoperative plan is for  Weightbearing as tolerated on the right lower extremity however she also has a proximal humerus fracture which will need closed treatment and nonweightbearing for a period of 3 weeks  The patient should be on DVT prophylaxis for 35 days  Follow-up should be scheduled for 2 weeks  Images can be obtained at that time.   No assistance  Spinal anesthesia  10 cc estimated blood loss  No blood administered  No drains  Local medication Marcaine with epinephrine 30 cc  SURGEON:  Surgeon(s) and Role:    Carole Civil, MD - Primary  No specimen  Counts and needle counts and sponges correct  No tourniquet  Dragon Dictation  PLAN OF CARE: Admit to inpatient   PATIENT DISPOSITION:  PACU - hemodynamically stable.   Delay start of Pharmacological VTE agent (>24hrs) due to surgical blood loss or risk of bleeding: yes

## 2022-03-26 NOTE — Anesthesia Preprocedure Evaluation (Signed)
Anesthesia Evaluation  Patient identified by MRN, date of birth, ID band Patient awake    Reviewed: Allergy & Precautions, H&P , NPO status , Patient's Chart, lab work & pertinent test results, reviewed documented beta blocker date and time   Airway Mallampati: II  TM Distance: >3 FB Neck ROM: full    Dental no notable dental hx.    Pulmonary neg pulmonary ROS, former smoker,    Pulmonary exam normal breath sounds clear to auscultation       Cardiovascular Exercise Tolerance: Good hypertension, negative cardio ROS   Rhythm:regular Rate:Normal     Neuro/Psych PSYCHIATRIC DISORDERS negative neurological ROS     GI/Hepatic Neg liver ROS, GERD  Medicated,  Endo/Other  negative endocrine ROS  Renal/GU negative Renal ROS  negative genitourinary   Musculoskeletal   Abdominal   Peds  Hematology negative hematology ROS (+)   Anesthesia Other Findings Chronic pain  Reproductive/Obstetrics negative OB ROS                             Anesthesia Physical Anesthesia Plan  ASA: 2  Anesthesia Plan: Spinal   Post-op Pain Management:    Induction:   PONV Risk Score and Plan: Propofol infusion  Airway Management Planned:   Additional Equipment:   Intra-op Plan:   Post-operative Plan:   Informed Consent: I have reviewed the patients History and Physical, chart, labs and discussed the procedure including the risks, benefits and alternatives for the proposed anesthesia with the patient or authorized representative who has indicated his/her understanding and acceptance.     Dental Advisory Given  Plan Discussed with: CRNA  Anesthesia Plan Comments:         Anesthesia Quick Evaluation

## 2022-03-26 NOTE — Transfer of Care (Addendum)
Immediate Anesthesia Transfer of Care Note  Patient: Caitlin Mckinney  Procedure(s) Performed: INTERNAL FIXATION RIGHT HIP (Right: Hip)  Patient Location: PACU  Anesthesia Type:Spinal  Level of Consciousness: awake, alert  and oriented  Airway & Oxygen Therapy: Patient Spontanous Breathing and Patient connected to nasal cannula oxygen  Post-op Assessment: Report given to RN, Post -op Vital signs reviewed and stable, Patient moving all extremities X 4 and Patient able to stick tongue midline  Post vital signs: Reviewed  Last Vitals:  Vitals Value Taken Time  BP 169/81 03/26/22 1402  Temp 97.5 03/26/22 1406  Pulse 93 03/26/22 1405  Resp 22 03/26/22 1405  SpO2 90 % 03/26/22 1405  Vitals shown include unvalidated device data.  Last Pain:  Vitals:   03/26/22 1114  TempSrc: Oral  PainSc: 9       Patients Stated Pain Goal: 7 (41/14/64 3142)  Complications: No notable events documented.

## 2022-03-26 NOTE — Brief Op Note (Signed)
03/26/2022  1:50 PM  PATIENT:  Caitlin Mckinney  71 y.o. female  PRE-OPERATIVE DIAGNOSIS:  right hip fracture  POST-OPERATIVE DIAGNOSIS:  right hip fracture  PROCEDURE:  Procedure(s): INTERNAL FIXATION RIGHT HIP (Right)  Right femoral neck fracture subcapital nondisplaced  Implants Biomet titanium screws x3 all 75 mm with a washer  No assistance  Spinal anesthesia  10 cc estimated blood loss  No blood administered  No drains  Local medication Marcaine with epinephrine 30 cc  SURGEON:  Surgeon(s) and Role:    Carole Civil, MD - Primary  No specimen  Counts and needle counts and sponges correct  No tourniquet  Dragon Dictation  PLAN OF CARE: Admit to inpatient   PATIENT DISPOSITION:  PACU - hemodynamically stable.   Delay start of Pharmacological VTE agent (>24hrs) due to surgical blood loss or risk of bleeding: yes

## 2022-03-26 NOTE — TOC Progression Note (Signed)
  Transition of Care [facility]) Screening Note   Patient Details  Name: Caitlin Mckinney Date of Birth: 1950/10/09   Transition of Care [facility]) CM/SW Contact:    Boneta Lucks, RN Phone Number: 03/26/2022, 12:00 PM  IN OR - Hip surg   Transition of Care Department [facility]) has reviewed patient and no TOC needs have been identified at this time. We will continue to monitor patient advancement through interdisciplinary progression rounds. If new patient transition needs arise, please place a TOC consult.       Barriers to Discharge: Continued Medical Work up

## 2022-03-26 NOTE — Assessment & Plan Note (Signed)
Continue Lexapro

## 2022-03-26 NOTE — H&P (Signed)
History and Physical    Patient: Caitlin Mckinney VHQ:[id number] DOB: January 18, 1951 DOA: 03/25/2022 DOS: the patient was seen and examined on 03/26/2022 PCP: Caitlin Richard, MD  Patient coming from: Home  Chief Complaint:  Chief Complaint  Patient presents with   Fall   HPI: Caitlin Mckinney is a 71 y.o. female with medical history significant of bone cancer, lung cancer, colon cancer, and hypertension presents to the ED with a chief complaint of fall.  Patient reports that she was outside walking her big dog when he pulled on the leash and she lost her balance and fell straight back.  Patient reports that she had immediate sharp pain on her right side.  She reports that it was at times gnawing.  Even with help from 2 other people she could not get up, and they had to call EMS to bring her in.  She reports that pain is still present if she moves her leg.  It is better with rest.  She has no chest pain, no palpitations.  Patient does not wear oxygen at home but is wearing oxygen at time of admission because she had oxygen sats drop after her dose of Dilaudid in the ED.  Patient denies any loss of consciousness.  She denies any blood thinners.  Patient is currently under the care of oncology and gets chemo.  Her last chemo was last Tuesday.  She finished her course of 10 radiation treatments approximately 3 weeks ago.  Prior to her fall patient was in her normal state of health.  Patient does not smoke, does not drink, does not use illicit drugs.  She is not vaccinated for COVID.  Patient is full code. Review of Systems: As mentioned in the history of present illness. All other systems reviewed and are negative. Past Medical History:  Diagnosis Date   Cancer [facility])    endometrial and colon 2010   Hypertension    Past Surgical History:  Procedure Laterality Date   ABDOMINAL HYSTERECTOMY     APPENDECTOMY     COLON RESECTION     COLON SURGERY     COLOSTOMY     COLOSTOMY REVERSAL     SHOULDER  ARTHROSCOPY     TUBAL LIGATION     Social History:  reports that she has quit smoking. She has never used smokeless tobacco. She reports that she does not drink alcohol and does not use drugs.  Allergies  Allergen Reactions   Contrast Media [Iodinated Contrast Media] Shortness Of Breath   Other     States her blood pressure medicine made her cough    Allevess [Capsaicin-Menthol] Rash   Erythromycin Rash   Naproxen Rash    History reviewed. No pertinent family history.  Prior to Admission medications   Medication Sig Start Date End Date Taking? Authorizing Provider  amLODipine (NORVASC) 5 MG tablet Take 5 mg by mouth daily. 09/27/19   [provider]  atenolol (TENORMIN) 100 MG tablet Take 50 mg by mouth daily.     [provider]  cholecalciferol (VITAMIN D) 1000 units tablet Take 1,000 Units by mouth daily.    [provider]  docusate sodium (COLACE) 100 MG capsule Take 1 capsule (100 mg total) by mouth every 12 (twelve) hours. 02/19/22   Sherwood Gambler, MD  escitalopram (LEXAPRO) 20 MG tablet Take 20 mg by mouth daily.    [provider]  fluticasone (FLONASE) 50 MCG/ACT nasal spray Place 1 spray into both nostrils daily as needed  for allergies or rhinitis.    [provider]  GUAIFENESIN PO Take 1 tablet by mouth daily as needed.    [provider]  Hypromellose (ARTIFICIAL TEARS) 0.4 % SOLN Apply 2 drops to eye 3 (three) times daily.    [provider]  loratadine (CLARITIN) 10 MG tablet Take 10 mg by mouth daily.    [provider]  Magnesium 250 MG TABS Take 250-500 mg by mouth at bedtime.    [provider]  methylphenidate (RITALIN) 10 MG tablet Take 10 mg by mouth daily.    [provider]  omeprazole (PRILOSEC) 20 MG capsule Take 1 capsule by mouth daily.    [provider]  polyethylene glycol (MIRALAX / GLYCOLAX) 17 g packet Take 17 g by mouth 2 (two) times daily as needed.  02/19/22   Sherwood Gambler, MD  potassium chloride SA (K-DUR,KLOR-CON) 20 MEQ tablet Take 1 tablet by mouth daily.    [provider]    Physical Exam: Vitals:   03/26/22 0006 03/26/22 0100 03/26/22 0158 03/26/22 0200  BP: 118/88 134/82  119/71  Pulse: 76 79  76  Resp: 18 18  18   Temp:   98.1 F (36.7 C)   TempSrc:   Oral   SpO2: 94% 95%  98%  Weight:      Height:       1.  General: Patient lying supine in bed,  no acute distress   2. Psychiatric: Alert and oriented x 3, mood and behavior normal for situation, pleasant and cooperative with exam   3. Neurologic: Speech and language are normal, face is symmetric, moves all 4 extremities voluntarily, at baseline without acute deficits on limited exam   4. HEENMT:  Head is atraumatic, normocephalic, pupils reactive to light, neck is supple, trachea is midline, mucous membranes are moist   5. Respiratory : Lungs are clear to auscultation bilaterally without wheezing, rhonchi, rales, no cyanosis, no increase in work of breathing or accessory muscle use   6. Cardiovascular : Heart rate normal, rhythm is regular, no murmurs, rubs or gallops, no peripheral edema, peripheral pulses palpated   7. Gastrointestinal:  Abdomen is soft, nondistended, nontender to palpation bowel sounds active, no masses or organomegaly palpated   8. Skin:  Skin is warm, dry and intact without rashes, acute lesions, or ulcers on limited exam   9.Musculoskeletal:  No acute deformities or trauma, no asymmetry in tone, no peripheral edema, peripheral pulses palpated, no tenderness to palpation in the extremities  Data Reviewed: In the ED Temp 98, heart rate 76-79, respiratory rate 18-20, blood pressure 118/82-180/88, satting 95% No leukocytosis White blood cell count of 7.9, hemoglobin 12.3, platelets 258 Chemistry is unremarkable CT right hip shows nondisplaced subcapital right femur fracture.  CT shoulder shows nondisplaced fracture of the  lateral cortex of the humeral neck Admission was requested after Ortho surgery consult for surgical repair of her hip in the a.m.  Assessment and Plan: * Hip fracture (Colon) - Continue pain control with Tylenol, oxycodone, morphine - Ortho consulted - Plan for surgery tomorrow - Type and screen, UA, n.p.o. - Continue to monitor  Humeral surgical neck fracture - Ortho consulted - Continue pain control  GERD (gastroesophageal reflux disease) Continue Protonix  Mood disorder (HCC) - Continue Lexapro  Essential hypertension Continue amlodipine and atenolol      Advance Care Planning:   Code Status: Full Code   Consults: Ortho  Family Communication: No family at bedside  Severity  of Illness: The appropriate patient status for this patient is OBSERVATION. Observation status is judged to be reasonable and necessary in order to provide the required intensity of service to ensure the patient's safety. The patient's presenting symptoms, physical exam findings, and initial radiographic and laboratory data in the context of their medical condition is felt to place them at decreased risk for further clinical deterioration. Furthermore, it is anticipated that the patient will be medically stable for discharge from the hospital within 2 midnights of admission.   Author: Rolla Plate, DO 03/26/2022 4:03 AM  For on call review [email or web address].

## 2022-03-27 DIAGNOSIS — S72001A Fracture of unspecified part of neck of right femur, initial encounter for closed fracture: Secondary | ICD-10-CM | POA: Diagnosis not present

## 2022-03-27 LAB — CBC
HCT: 33.8 % — ABNORMAL LOW (ref 36.0–46.0)
Hemoglobin: 11.5 g/dL — ABNORMAL LOW (ref 12.0–15.0)
MCH: 29.6 pg (ref 26.0–34.0)
MCHC: 34 g/dL (ref 30.0–36.0)
MCV: 87.1 fL (ref 80.0–100.0)
Platelets: 175 10*3/uL (ref 150–400)
RBC: 3.88 MIL/uL (ref 3.87–5.11)
RDW: 15.2 % (ref 11.5–15.5)
WBC: 6.3 10*3/uL (ref 4.0–10.5)
nRBC: 0 % (ref 0.0–0.2)

## 2022-03-27 LAB — BASIC METABOLIC PANEL
Anion gap: 3 — ABNORMAL LOW (ref 5–15)
BUN: 12 mg/dL (ref 8–23)
CO2: 26 mmol/L (ref 22–32)
Calcium: 8.1 mg/dL — ABNORMAL LOW (ref 8.9–10.3)
Chloride: 105 mmol/L (ref 98–111)
Creatinine, Ser: 0.42 mg/dL — ABNORMAL LOW (ref 0.44–1.00)
GFR, Estimated: >60 mL/min (ref >60)
Glucose, Bld: 115 mg/dL — ABNORMAL HIGH (ref 70–99)
Potassium: 3.1 mmol/L — ABNORMAL LOW (ref 3.5–5.1)
Sodium: 134 mmol/L — ABNORMAL LOW (ref 135–145)

## 2022-03-27 MED ORDER — ENOXAPARIN SODIUM 40 MG/0.4ML IJ SOSY
40.0000 mg | PREFILLED_SYRINGE | INTRAMUSCULAR | Status: DC
  Administered 2022-03-28 – 2022-03-30 (×3): 40 mg via SUBCUTANEOUS
  Filled 2022-03-27 (×3): qty 0.4

## 2022-03-27 MED ORDER — POTASSIUM CHLORIDE CRYS ER 20 MEQ PO TBCR
40.0000 meq | EXTENDED_RELEASE_TABLET | ORAL | Status: AC
  Administered 2022-03-27 (×2): 40 meq via ORAL
  Filled 2022-03-27 (×2): qty 2

## 2022-03-27 MED ORDER — HYDROMORPHONE HCL 1 MG/ML IJ SOLN
0.5000 mg | INTRAMUSCULAR | Status: DC | PRN
  Administered 2022-03-27 – 2022-03-30 (×9): 0.5 mg via INTRAVENOUS
  Filled 2022-03-27 (×10): qty 0.5

## 2022-03-27 MED ORDER — CHLORHEXIDINE GLUCONATE CLOTH 2 % EX PADS
6.0000 | MEDICATED_PAD | Freq: Every day | CUTANEOUS | Status: DC
  Administered 2022-03-27 – 2022-03-30 (×4): 6 via TOPICAL

## 2022-03-27 MED ORDER — TRAMADOL HCL 50 MG PO TABS: 50.0000 mg | ORAL_TABLET | Freq: Four times a day (QID) | ORAL | Status: DC | PRN

## 2022-03-27 MED ORDER — HYDROCODONE-ACETAMINOPHEN 7.5-325 MG PO TABS: 1.0000 | ORAL_TABLET | ORAL | Status: DC | PRN

## 2022-03-27 NOTE — Evaluation (Signed)
Physical Therapy Evaluation Patient Details Name: Caitlin Mckinney MRN: [id number] DOB: May 21, 1951 Today's Date: 03/27/2022  History of Present Illness  Caitlin Mckinney is a 71 y.o. female with medical history significant of bone cancer, lung cancer, colon cancer, and hypertension presents to the ED with a chief complaint of fall.  Patient reports that she was outside walking her big dog when he pulled on the leash and she lost her balance and fell straight back.  Patient reports that she had immediate sharp pain on her right side.  She reports that it was at times gnawing.  Even with help from 2 other people she could not get up, and they had to call EMS to bring her in.  She reports that pain is still present if she moves her leg.  It is better with rest.  She has no chest pain, no palpitations.  Patient does not wear oxygen at home but is wearing oxygen at time of admission because she had oxygen sats drop after her dose of Dilaudid in the ED.  Patient denies any loss of consciousness.  She denies any blood thinners.  Patient is currently under the care of oncology and gets chemo.  Her last chemo was last Tuesday.  She finished her course of 10 radiation treatments approximately 3 weeks ago.  Prior to her fall patient was in her normal state of health.   Clinical Impression  Patient demonstrates labored movement for sitting up at bedside requiring mod/max assist for supine to sit transfer. Patient very unsteady on feet requiring verbal cueing for ambulation with quad cane, and limited to a few side steps before having to sit due to hip pain and fatigue. Patient tolerated sitting up in chair after therapy - nursing staff notified. Patient will benefit from continued skilled physical therapy in hospital and recommended venue below to increase strength, balance, endurance for safe ADLs and gait.      Recommendations for follow up therapy are one component of a multi-disciplinary discharge planning  process, led by the attending physician.  Recommendations may be updated based on patient status, additional functional criteria and insurance authorization.  Follow Up Recommendations Skilled nursing-short term rehab (<3 hours/day) Can patient physically be transported by private vehicle: No    Assistance Recommended at Discharge Set up Supervision/Assistance  Patient can return home with the following  A lot of help with walking and/or transfers;A lot of help with bathing/dressing/bathroom;Assistance with cooking/housework;Help with stairs or ramp for entrance    Equipment Recommendations Cane (quad cane)  Recommendations for Other Services       Functional Status Assessment Patient has had a recent decline in their functional status and demonstrates the ability to make significant improvements in function in a reasonable and predictable amount of time.     Precautions / Restrictions Precautions Precautions: Fall Required Braces or Orthoses: Sling Restrictions Weight Bearing Restrictions: Yes RUE Weight Bearing: Non weight bearing      Mobility  Bed Mobility Overal bed mobility: Needs Assistance Bed Mobility: Supine to Sit     Supine to sit: Mod assist, Max assist     General bed mobility comments: Patient requiring help to move bil LE off edge of bed, hand-held assist for sitting up, mod/max assist for scooting to EOB    Transfers Overall transfer level: Needs assistance Equipment used: Quad cane Transfers: Sit to/from Stand, Bed to chair/wheelchair/BSC Sit to Stand: Min assist, Min guard   Step pivot transfers: Mod assist  General transfer comment: use of quad cane    Ambulation/Gait Ambulation/Gait assistance: Min assist, Min guard Gait Distance (Feet): 5 Feet Assistive device: Quad cane Gait Pattern/deviations: Decreased step length - right, Decreased step length - left, Decreased stance time - right, Decreased stride length Gait velocity:  decreased     General Gait Details: Patient limited to a few side steps to get to chair, limited mostly due to generalized weakness and c/o right hip pain  Stairs            Wheelchair Mobility    Modified Rankin (Stroke Patients Only)       Balance Overall balance assessment: Needs assistance Sitting-balance support: Single extremity supported, Feet supported Sitting balance-Leahy Scale: Good Sitting balance - Comments: fair/good seated EOB   Standing balance support: Single extremity supported, During functional activity, Reliant on assistive device for balance Standing balance-Leahy Scale: Fair Standing balance comment: Patient very unsteady on feet, requiring use of quad cane and verbal cueing for stepping with cane                             Pertinent Vitals/Pain Pain Assessment Pain Assessment: 0-10 Pain Score: 6  Pain Location: right hip Pain Descriptors / Indicators: Aching Pain Intervention(s): Limited activity within patient's tolerance, Monitored during session, Repositioned, Premedicated before session, Patient requesting pain meds-RN notified    Home Living Family/patient expects to be discharged to:: Private residence Living Arrangements: Other relatives;Other (Comment) (Patient reports she lives with her 2 grandchildren (teenagers)) Available Help at Discharge: Available 24 hours/day;Neighbor;Family Type of Home: Mobile home Home Access: Stairs to enter Entrance Stairs-Rails: Left Entrance Stairs-Number of Steps: 3   Home Layout: One level Home Equipment: Shower seat      Prior Function Prior Level of Function : Independent/Modified Independent             Mobility Comments: Patient is a Hydrographic surveyor, drives, and doesn't use an AD at baseline ADLs Comments: iADL asssitance from 3 neighbors and grandchildren     Hand Dominance   Dominant Hand: Right    Extremity/Trunk Assessment   Upper Extremity Assessment Upper  Extremity Assessment: Generalized weakness    Lower Extremity Assessment Lower Extremity Assessment: Generalized weakness    Cervical / Trunk Assessment Cervical / Trunk Assessment: Normal  Communication   Communication: No difficulties  Cognition Arousal/Alertness: Awake/alert Behavior During Therapy: WFL for tasks assessed/performed Overall Cognitive Status: Within Functional Limits for tasks assessed                                          General Comments      Exercises     Assessment/Plan    PT Assessment Patient needs continued PT services  PT Problem List Decreased strength;Decreased activity tolerance;Decreased balance;Decreased mobility       PT Treatment Interventions DME instruction;Gait training;Stair training;Functional mobility training;Therapeutic activities;Therapeutic exercise;Balance training;Patient/family education    PT Goals (Current goals can be found in the Care Plan section)  Acute Rehab PT Goals Patient Stated Goal: return home with family after rehab PT Goal Formulation: With patient Time For Goal Achievement: 04/09/22 Potential to Achieve Goals: Good    Frequency Min 3X/week     Co-evaluation               AM-PAC PT "6 Clicks" Mobility  Outcome Measure Help  needed turning from your back to your side while in a flat bed without using bedrails?: A Little Help needed moving from lying on your back to sitting on the side of a flat bed without using bedrails?: A Lot Help needed moving to and from a bed to a chair (including a wheelchair)?: A Lot Help needed standing up from a chair using your arms (e.g., wheelchair or bedside chair)?: A Little Help needed to walk in hospital room?: A Lot Help needed climbing 3-5 steps with a railing? : Total 6 Click Score: 13    End of Session   Activity Tolerance: Patient tolerated treatment well;Patient limited by fatigue;Patient limited by pain Patient left: in chair;with call  bell/phone within reach Nurse Communication: Mobility status PT Visit Diagnosis: Unsteadiness on feet (R26.81);Other abnormalities of gait and mobility (R26.89);Muscle weakness (generalized) (M62.81)    Time: 5364-6803 PT Time Calculation (min) (ACUTE ONLY): 28 min   Charges:   PT Evaluation $PT Eval Moderate Complexity: 1 Mod PT Treatments $Therapeutic Activity: 23-37 mins        Zigmund Gottron, SPT

## 2022-03-27 NOTE — Progress Notes (Addendum)
Subjective: 1 Day Post-Op Procedure(s) (LRB): INTERNAL FIXATION RIGHT HIP (Right) Patient reports pain as  controlled .    Objective: Vital signs in last 24 hours: Temp:  [97.5 F (36.4 C)-98.7 F (37.1 C)] 97.5 F (36.4 C) (09/29 0524) Pulse Rate:  [69-93] 81 (09/29 0524) Resp:  [12-27] 19 (09/29 0524) BP: (117-169)/(66-83) 121/66 (09/29 0524) SpO2:  [91 %-100 %] 95 % (09/29 0524) Weight:  [54 kg-56.2 kg] 54 kg (09/28 1509)  Intake/Output from previous day: 09/28 0701 - 09/29 0700 In: 900 [I.V.:800; IV Piggyback:100] Out: 1435 [Urine:1425; Blood:10] Intake/Output this shift: No intake/output data recorded.  Recent Labs    03/26/22 0101 03/26/22 0336 03/26/22 1805 03/27/22 0634  HGB 12.3 13.0 11.1* 11.5*   Recent Labs    03/26/22 1805 03/27/22 0634  WBC 5.3 6.3  RBC 3.84* 3.88  HCT 33.7* 33.8*  PLT 178 175   Recent Labs    03/26/22 0336 03/26/22 1805 03/27/22 0634  NA 135  --  134*  K 3.9  --  3.1*  CL 102  --  105  CO2 25  --  26  BUN 14  --  12  CREATININE 0.58 0.46 0.42*  GLUCOSE 122*  --  115*  CALCIUM 9.0  --  8.1*   Recent Labs    03/26/22 0101  INR 1.1    Clean dressing neurovascular intact normal leg alignment   Assessment/Plan: 1 Day Post-Op Procedure(s) (LRB): INTERNAL FIXATION RIGHT HIP (Right) Advance diet Up with therapy D/C IV fluids      Arther Abbott 03/27/2022, 7:28 AM

## 2022-03-27 NOTE — Care Management Important Message (Signed)
Important Message  Patient Details  Name: Caitlin Mckinney MRN: [id number] Date of Birth: 01-Jun-1951   Medicare Important Message Given:  Yes     Tommy Medal 03/27/2022, 11:18 AM

## 2022-03-27 NOTE — Anesthesia Postprocedure Evaluation (Signed)
Anesthesia Post Note  Patient: Caitlin Mckinney  Procedure(s) Performed: INTERNAL FIXATION RIGHT HIP (Right: Hip)  Patient location during evaluation: Phase II Anesthesia Type: Spinal Level of consciousness: awake Pain management: pain level controlled Vital Signs Assessment: post-procedure vital signs reviewed and stable Respiratory status: spontaneous breathing and respiratory function stable Cardiovascular status: blood pressure returned to baseline and stable Postop Assessment: no headache and no apparent nausea or vomiting Anesthetic complications: no Comments: Late entry   No notable events documented.   Last Vitals:  Vitals:   03/27/22 0313 03/27/22 0524  BP: 118/67 121/66  Pulse: 90 81  Resp: 15 19  Temp: 36.7 C (!) 36.4 C  SpO2: 93% 95%    Last Pain:  Vitals:   03/27/22 0926  TempSrc:   PainSc: [place]

## 2022-03-27 NOTE — TOC Initial Note (Signed)
Transition of Care [facility]) - Initial/Assessment Note    Patient Details  Name: Caitlin Mckinney MRN: [id number] Date of Birth: 1950/12/30  [facility] CM/SW Contact:    Boneta Lucks, RN Phone Number: 03/27/2022, 1:23 PM  Clinical Narrative:         Patient admitted with hip fracture, from home. PT is recommending SNF. Patient is agreeable. Options given, she is requesting [facility]. [facility] sent out for bed offer.           Expected Discharge Plan: Skilled Nursing Facility Barriers to Discharge: Continued Medical Work up   Patient Goals and CMS Choice Patient states their goals for this hospitalization and ongoing recovery are:: agreeable to SNF CMS Medicare.gov Compare Post Acute Care list provided to:: Patient Choice offered to / list presented to : Patient  Expected Discharge Plan and Services Expected Discharge Plan: [place]       Living arrangements for the past 2 months: Single Family Home                     Prior Living Arrangements/Services Living arrangements for the past 2 months: Single Family Home Lives with:: Spouse Patient language and need for interpreter reviewed:: Yes Do you feel safe going back to the place where you live?: Yes      Need for Family Participation in Patient Care: Yes (Comment) Care giver support system in place?: Yes (comment)   Criminal Activity/Legal Involvement Pertinent to Current Situation/Hospitalization: No - Comment as needed  Activities of Daily Living Home Assistive Devices/Equipment: None ADL Screening (condition at time of admission) Patient's cognitive ability adequate to safely complete daily activities?: No Is the patient deaf or have difficulty hearing?: No Does the patient have difficulty seeing, even when wearing glasses/contacts?: No Does the patient have difficulty concentrating, remembering, or making decisions?: No Patient able to express need for assistance with ADLs?:  Yes Does the patient have difficulty dressing or bathing?: Yes Independently performs ADLs?: No Communication: Independent Dressing (OT): Needs assistance Is this a change from baseline?: Change from baseline, expected to last <3days Grooming: Needs assistance Is this a change from baseline?: Change from baseline, expected to last <3 days Feeding: Independent Bathing: Needs assistance Is this a change from baseline?: Change from baseline, expected to last <3 days Toileting: Needs assistance Is this a change from baseline?: Change from baseline, expected to last <3 days In/Out Bed: Needs assistance Is this a change from baseline?: Change from baseline, expected to last >3 days Walks in Home: Needs assistance Is this a change from baseline?: Change from baseline, expected to last >3 days Does the patient have difficulty walking or climbing stairs?: Yes Weakness of Legs: Both Weakness of Arms/Hands: Both  Permission Sought/Granted              Emotional Assessment   Attitude/Demeanor/Rapport: Engaged Affect (typically observed): Accepting Orientation: : Oriented to Self, Oriented to Place, Oriented to  Time, Oriented to Situation Alcohol / Substance Use: Not Applicable Psych Involvement: No (comment)  Admission diagnosis:  Hip fracture ([place]) [S72.009A] Closed nondisplaced intertrochanteric fracture of right femur, initial encounter ([place]) [S72.144A] Closed traumatic nondisplaced fracture of proximal end of right humerus, initial encounter [S42.201A] Patient Active Problem List   Diagnosis Date Noted   Hip fracture ([place]) 03/26/2022   Essential hypertension 03/26/2022   Mood disorder ([place]) 03/26/2022   GERD (gastroesophageal reflux disease) 03/26/2022   Humeral surgical neck fracture 03/26/2022   Traumatic closed nondisplaced fracture  of proximal end of right humerus    Unilateral primary osteoarthritis, left knee 03/16/2018   Unilateral primary osteoarthritis, right knee  02/16/2018   Pain in joint, shoulder region 03/10/2011   Complete rupture of rotator cuff 03/10/2011   Muscle weakness (generalized) 03/10/2011   PCP:  Abran Richard, MD Pharmacy:   [place], [place] - [address] [place] [address] Phone: [phone] Fax: [phone]     Readmission Risk Interventions    03/27/2022    1:21 PM  Readmission Risk Prevention Plan  Post Dischage Appt Not Complete  Medication Screening Complete  Transportation Screening Complete

## 2022-03-27 NOTE — NC FL2 (Signed)
[place] LEVEL OF CARE SCREENING TOOL     IDENTIFICATION  Patient Name: Caitlin Mckinney Birthdate: 1950-09-06 Sex: female Admission Date (Current Location): 03/25/2022  [facility] and [place] Number:  [employer] and Address:  [place] [address] [place]      Provider Number: [id number]  Attending Physician Name and Address:  Shelly Coss, MD  Relative Name and Phone Number:  Kaitlynn, Tramontana)   [phone]    Current Level of Care: Hospital Recommended Level of Care: [place] Prior Approval Number:    Date Approved/Denied:   PASRR Number: [id number]  Discharge Plan: SNF    Current Diagnoses: Patient Active Problem List   Diagnosis Date Noted   Hip fracture ([place]) 03/26/2022   Essential hypertension 03/26/2022   Mood disorder ([place]) 03/26/2022   GERD (gastroesophageal reflux disease) 03/26/2022   Humeral surgical neck fracture 03/26/2022   Traumatic closed nondisplaced fracture of proximal end of right humerus    Unilateral primary osteoarthritis, left knee 03/16/2018   Unilateral primary osteoarthritis, right knee 02/16/2018   Pain in joint, shoulder region 03/10/2011   Complete rupture of rotator cuff 03/10/2011   Muscle weakness (generalized) 03/10/2011    Orientation RESPIRATION BLADDER Height & Weight     Self, Time, Situation, Place  Normal External catheter, Continent Weight: 54 kg Height:  5' (152.4 cm)  BEHAVIORAL SYMPTOMS/MOOD NEUROLOGICAL BOWEL NUTRITION STATUS      Continent Diet (See DC Summary)  AMBULATORY STATUS COMMUNICATION OF NEEDS Skin   Extensive Assist Verbally Surgical wounds                       Personal Care Assistance Level of Assistance  Bathing, Feeding, Dressing Bathing Assistance: Maximum assistance Feeding assistance: Independent Dressing Assistance: Limited assistance     Functional Limitations Info  Sight, Hearing, Speech Sight  Info: Impaired Hearing Info: Adequate Speech Info: Adequate    SPECIAL CARE FACTORS FREQUENCY  PT (By licensed PT)     PT Frequency: 5 times a week              Contractures Contractures Info: Not present    Additional Factors Info  Code Status, Allergies Code Status Info: FULL Allergies Info: Contrast media, Allevess, erythromycin, naproxen           Current Medications (03/27/2022):  This is the current hospital active medication list Current Facility-Administered Medications  Medication Dose Route Frequency Provider Last Rate Last Admin   acetaminophen (OFIRMEV) IV 1,000 mg  1,000 mg Intravenous Q6H Carole Civil, MD 400 mL/hr at 03/27/22 1134 1,000 mg at 03/27/22 1134   acetaminophen (TYLENOL) tablet 650 mg  650 mg Oral Q6H PRN Carole Civil, MD       Or   acetaminophen (TYLENOL) suppository 650 mg  650 mg Rectal Q6H PRN Carole Civil, MD       amLODipine (NORVASC) tablet 5 mg  5 mg Oral Daily Carole Civil, MD   5 mg at 03/27/22 0840   atenolol (TENORMIN) tablet 50 mg  50 mg Oral Daily Carole Civil, MD   50 mg at 03/27/22 0840   Chlorhexidine Gluconate Cloth 2 % PADS 6 each  6 each Topical Daily Shelly Coss, MD   6 each at 03/27/22 0856   docusate sodium (COLACE) capsule 100 mg  100 mg Oral BID Carole Civil, MD   100 mg at 03/27/22 0840   [START  ON 03/28/2022] enoxaparin (LOVENOX) injection 40 mg  40 mg Subcutaneous Q24H Adhikari, Amrit, MD       escitalopram (LEXAPRO) tablet 20 mg  20 mg Oral Daily Carole Civil, MD   20 mg at 03/27/22 0840   HYDROcodone-acetaminophen (NORCO) 7.5-325 MG per tablet 1-2 tablet  1-2 tablet Oral Q4H PRN Shelly Coss, MD       methocarbamol (ROBAXIN) tablet 500 mg  500 mg Oral Q6H PRN Carole Civil, MD       Or   methocarbamol (ROBAXIN) 500 mg in dextrose 5 % 50 mL IVPB  500 mg Intravenous Q6H PRN Carole Civil, MD       metoCLOPramide (REGLAN) tablet 5-10 mg  5-10 mg Oral Q8H  PRN Carole Civil, MD       Or   metoCLOPramide (REGLAN) injection 5-10 mg  5-10 mg Intravenous Q8H PRN Carole Civil, MD   10 mg at 03/27/22 0247   morphine (PF) 2 MG/ML injection 2 mg  2 mg Intravenous Q2H PRN Carole Civil, MD   2 mg at 03/27/22 0856   ondansetron (ZOFRAN) tablet 4 mg  4 mg Oral Q6H PRN Carole Civil, MD       Or   ondansetron [facility]) injection 4 mg  4 mg Intravenous Q6H PRN Carole Civil, MD   4 mg at 03/27/22 0207   pantoprazole (PROTONIX) EC tablet 40 mg  40 mg Oral Daily Carole Civil, MD   40 mg at 03/27/22 0840   traMADol (ULTRAM) tablet 50 mg  50 mg Oral Once Carole Civil, MD         Discharge Medications: Please see discharge summary for a list of discharge medications.  Relevant Imaging Results:  Relevant Lab Results:   Additional Information SS# [id number]  Boneta Lucks, RN

## 2022-03-27 NOTE — Progress Notes (Signed)
Patient complaining of bladder pressure/pain. Bladder scan read 679ml. Secure chat sent to Dr. Clearence Ped and orders placed by MD to in and out cath. Performed in and out cath using sterile technique and measured 770ml of yellow, clear urine. Patient states she feels relieved. Secure chat sent to Dr. Clearence Ped of measured output.

## 2022-03-27 NOTE — Progress Notes (Signed)
Pt c/o of lower abd pressure to this nurse, pt had also not voided for several hours after foley removal earlier in the shift. Bladder scan performed which showed 573ml's. MD notified, new orders placed for In and Out cath. This nurse and an RN performed this procedure using sterile technique. 548ml's drained, pt says she felt much better. Instructed to continue to push fluids. MD aware.

## 2022-03-27 NOTE — Progress Notes (Signed)
PROGRESS NOTE  Caitlin Mckinney  NKN:[id number] DOB: 1951/01/25 DOA: 03/25/2022 PCP: Abran Richard, MD   Brief Narrative: Patient is a 71 year old female with history of bone cancer, lung cancer, colon cancer status post colostomy placement, hypertension who presents from home after a fall.  She lives with her family.  She usually walks fine.  She was walking outside with her dog when he pulled on the leash and she lost her balance and fell.  Immediately felt sharp pain on her right side. Imaging showed nondisplaced subcapital right femur fracture.  Status post ORIF on 9/28.  Pending PT/OT evaluation.    Assessment & Plan:  Principal Problem:   Hip fracture ([place]) Active Problems:   Essential hypertension   Mood disorder (H[facility])   GERD (gastroesophageal reflux disease)   Humeral surgical neck fracture   Traumatic closed nondisplaced fracture of proximal end of right humerus  Right hip fracture: CT showed nondisplaced subcapital right femur fracture with fracture line extending into the greater trochanter and lesser trochanters.  Continue pain management, supportive care.  Orthopedics following, S/p  ORIF.  PT/OT will be consulted .Chemical DVT prophylaxis with lovenox  Right Humerus  fracture: Imaging showed subtle nondisplaced fracture of the lateral cortex of the humeral neck.  Management as per orthopedics, currently in sling.  Currently the plan is for conservative management.   History of lung/bone cancer: Currently on chemo and radiation.  Follows with oncology.  CT of right shoulder showed large destructive soft tissue mass at the right sternoclavicular junction compatible with metastasis.  Will recommend outpatient follow-up with oncology.  Colon cancer: Likely in remission.  Status post colostomy  GERD: Continue Protonix  Mood disorder: Continue Lexapro  Hypertension: Continue amlodipine, atenolol  Hypokalemia: Supplemented with potassium.        DVT  prophylaxis:enoxaparin (LOVENOX) injection 30 mg Start: 03/27/22 0800 SCDs Start: 03/26/22 1738 Place TED hose Start: 03/26/22 1738 SCDs Start: 03/26/22 0240     Code Status: Full Code  Family Communication: None at bedside  Patient status:Inpatient  Patient is from :Home  Anticipated discharge FX:[facility] SNF  Estimated DC date:2-3 days   Consultants: Orthopedics  Procedures:ORIF  Antimicrobials:  Anti-infectives (From admission, onward)    Start     Dose/Rate Route Frequency Ordered Stop   03/26/22 1830  ceFAZolin (ANCEF) IVPB 2g/100 mL premix        2 g 200 mL/hr over 30 Minutes Intravenous Every 6 hours 03/26/22 1737 03/27/22 0148   03/26/22 1130  ceFAZolin (ANCEF) IVPB 2g/100 mL premix        2 g 200 mL/hr over 30 Minutes Intravenous On call to O.R. 03/26/22 1119 03/26/22 1239   03/26/22 1056  ceFAZolin (ANCEF) 2-4 GM/100ML-% IVPB       Note to Pharmacy: Reed Breech H: cabinet override      03/26/22 1056 03/26/22 1240       Subjective:  Patient seen and examined at the bedside today.  Hemodynamically stable.  Complains of pain on the operated area of the hip  Objective: Vitals:   03/26/22 1939 03/26/22 2303 03/27/22 0313 03/27/22 0524  BP: (!) 142/75 136/80 118/67 121/66  Pulse: 85 88 90 81  Resp: 18 17 15 19   Temp: 98.4 F (36.9 C) 98.1 F (36.7 C) 98.1 F (36.7 C) (!) 97.5 F (36.4 C)  TempSrc:      SpO2: 93% 94% 93% 95%  Weight:      Height:        Intake/Output  Summary (Last 24 hours) at 03/27/2022 1107 Last data filed at 03/27/2022 0539 Gross per 24 hour  Intake 900 ml  Output 1435 ml  Net -535 ml   Filed Weights   03/25/22 2254 03/26/22 1114 03/26/22 1509  Weight: 56.2 kg 56.2 kg 54 kg    Examination:  General exam: Overall comfortable, not in distress, pleasant female HEENT: PERRL Respiratory system:  no wheezes or crackles  Cardiovascular system: S1 & S2 heard, RRR.  Gastrointestinal system: Abdomen is nondistended, soft and  nontender.Colostomy Central nervous system: Alert and oriented Extremities: No edema, no clubbing ,no cyanosis, surgical wound  on the right hip Skin: No rashes, no ulcers,no icterus     Data Reviewed: I have personally reviewed following labs and imaging studies  CBC: Recent Labs  Lab 03/26/22 0101 03/26/22 0336 03/26/22 1805 03/27/22 0634  WBC 7.9 6.4 5.3 6.3  NEUTROABS 6.2 4.7  --   --   HGB 12.3 13.0 11.1* 11.5*  HCT 37.3 40.1 33.7* 33.8*  MCV 87.8 89.1 87.8 87.1  PLT 258 255 178 825   Basic Metabolic Panel: Recent Labs  Lab 03/26/22 0101 03/26/22 0336 03/26/22 1805 03/27/22 0634  NA 135 135  --  134*  K 3.7 3.9  --  3.1*  CL 102 102  --  105  CO2 24 25  --  26  GLUCOSE 117* 122*  --  115*  BUN 13 14  --  12  CREATININE 0.67 0.58 0.46 0.42*  CALCIUM 8.8* 9.0  --  8.1*  MG  --  2.0  --   --      No results found for this or any previous visit (from the past 240 hour(s)).   Radiology Studies: DG HIP UNILAT WITH PELVIS 2-3 VIEWS RIGHT  Result Date: 03/26/2022 CLINICAL DATA:  Postop internal fixation of right hip. EXAM: DG HIP (WITH OR WITHOUT PELVIS) 2-3V RIGHT COMPARISON:  Intraoperative fluoroscopic images 03/26/2022. Right hip radiographs 03/25/2022 and CT 03/26/2022. FINDINGS: ORIF of the right femur is noted with screws traversing the femoral neck and terminating in the head. Alignment appears anatomic. The femoral head is approximated with the acetabulum. Skin staples are noted lateral to the hip, and there are surgical clips in the pelvis. IMPRESSION: ORIF of the right femur as above. Electronically Signed   By: Logan Bores M.D.   On: 03/26/2022 14:33   DG HIP UNILAT WITH PELVIS 2-3 VIEWS RIGHT  Result Date: 03/26/2022 CLINICAL DATA:  Fluoroscopic assistance for internal fixation of fracture of neck of right femur EXAM: DG HIP (WITH OR WITHOUT PELVIS) 2-3V RIGHT COMPARISON:  CT done on 03/25/2022 FINDINGS: Fluoroscopic images show internal fixation of  fracture of neck of right femur with 3 surgical screws. Fluoroscopic time 50 seconds. Radiation dose 9.81 mGy. IMPRESSION: Fluoroscopic assistance was provided for internal fixation of fracture of neck of right femur. Electronically Signed   By: Elmer Picker M.D.   On: 03/26/2022 14:05   DG C-Arm 1-60 Min-No Report  Result Date: 03/26/2022 Fluoroscopy was utilized by the requesting physician.  No radiographic interpretation.   CT Hip Right Wo Contrast  Result Date: 03/26/2022 CLINICAL DATA:  Hip trauma after mechanical fall EXAM: CT OF THE RIGHT HIP WITHOUT CONTRAST TECHNIQUE: Multidetector CT imaging of the right hip was performed according to the standard protocol. Multiplanar CT image reconstructions were also generated. RADIATION DOSE REDUCTION: This exam was performed according to the departmental dose-optimization program which includes automated exposure control, adjustment of the  mA and/or kV according to patient size and/or use of iterative reconstruction technique. COMPARISON:  Radiographs 03/25/2022 FINDINGS: Bones/Joint/Cartilage Nondisplaced subcapital right femur fracture with fracture line extending posteriorly into the greater trochanter and inferiorly along the medial femoral metadiaphysis. No dislocation. Ligaments Suboptimally assessed by CT. Muscles and Tendons Calcifications within the right proximal hamstring likely due to tendinopathy. Soft tissues Redemonstrated nodularity about the surgical clips in the posterior right pelvis. IMPRESSION: Nondisplaced subcapital right femur fracture with fracture line extending into the greater trochanter and lesser trochanters. Redemonstrated nodular thickening in the posterior pelvis about the surgical clips as seen on CT abdomen and pelvis 02/19/2022. Electronically Signed   By: Placido Sou M.D.   On: 03/26/2022 00:29   CT SHOULDER RIGHT WO CONTRAST  Result Date: 03/26/2022 CLINICAL DATA:  Upper arm trauma from mechanical fall with  right shoulder pain EXAM: CT OF THE UPPER RIGHT EXTREMITY WITHOUT CONTRAST TECHNIQUE: Multidetector CT imaging of the upper right extremity was performed according to the standard protocol. RADIATION DOSE REDUCTION: This exam was performed according to the departmental dose-optimization program which includes automated exposure control, adjustment of the mA and/or kV according to patient size and/or use of iterative reconstruction technique. COMPARISON:  Shoulder radiographs 03/25/2022 FINDINGS: Bones/Joint/Cartilage Cortical step-off about the lateral humeral neck corresponds to the abnormality on radiographs earlier today and is compatible with nondisplaced fracture (see series 9/image 27 and series 5/image 46). No additional fracture identified. No dislocation. Large destructive soft tissue mass measuring 6.6 x 3.6 cm at the right sternoclavicular junction with destruction of the proximal clavicle and superior aspect of the anterior first rib. Ligaments Suboptimally assessed by CT. Muscles and Tendons Calcific densities posterior to the humerus likely due to infraspinatus tendinopathy. Soft tissues Numerous pulmonary nodules some of which are cavitary in the right lung compatible with metastases. IMPRESSION: Subtle nondisplaced fracture of the lateral cortex of the humeral neck. Large destructive soft tissue mass at the right sternoclavicular junction compatible with metastasis. Multiple pulmonary metastases. Electronically Signed   By: Placido Sou M.D.   On: 03/26/2022 00:18   DG Shoulder Right  Result Date: 03/25/2022 CLINICAL DATA:  Fall, pain EXAM: RIGHT SHOULDER - 2+ VIEW COMPARISON:  None Available. FINDINGS: There is cortical irregularity noted in the lateral humeral neck region. No well-defined fracture crosses the humeral neck, but findings are suspicious for occult fracture. No subluxation or dislocation. Numerous pulmonary nodules and masses within the right lung. IMPRESSION: Cortical  irregularity laterally in the region of the humeral neck. Findings concerning for subtle or occult humeral neck fracture. Consider further evaluation with CT. Numerous pulmonary metastases. Electronically Signed   By: Rolm Baptise M.D.   On: 03/25/2022 23:19   DG Hip Unilat W or Wo Pelvis 2-3 Views Right  Result Date: 03/25/2022 CLINICAL DATA:  Fall, pain EXAM: DG HIP (WITH OR WITHOUT PELVIS) 2-3V RIGHT COMPARISON:  None Available. FINDINGS: Mild degenerative changes in the hips, symmetric. SI joints symmetric and unremarkable. No acute bony abnormality. Specifically, no fracture, subluxation, or dislocation. IMPRESSION: No acute bony abnormality. Electronically Signed   By: Rolm Baptise M.D.   On: 03/25/2022 23:17    Scheduled Meds:  amLODipine  5 mg Oral Daily   atenolol  50 mg Oral Daily   Chlorhexidine Gluconate Cloth  6 each Topical Daily   docusate sodium  100 mg Oral BID   enoxaparin (LOVENOX) injection  30 mg Subcutaneous Q24H   escitalopram  20 mg Oral Daily   pantoprazole  40  mg Oral Daily   potassium chloride  40 mEq Oral Q2H   traMADol  50 mg Oral Once   Continuous Infusions:  acetaminophen 1,000 mg (03/27/22 0244)   methocarbamol (ROBAXIN) IV       LOS: 1 day   Shelly Coss, MD [facility] P9/29/2023, 11:07 AM

## 2022-03-27 NOTE — Progress Notes (Signed)
Assumed care from [facility], RN. Patient asleep during bedside rounding. Bedalarm on for safety, call light within reach. Appeared comfortable, no signs of distress/discomfort noted.

## 2022-03-27 NOTE — Plan of Care (Signed)
  Problem: Acute Rehab PT Goals(only PT should resolve) Goal: Pt Will Go Supine/Side To Sit Outcome: Progressing Flowsheets (Taken 03/27/2022 1211) Pt will go Supine/Side to Sit:  with minimal assist  with moderate assist Goal: Patient Will Transfer Sit To/From Stand Outcome: Progressing Flowsheets (Taken 03/27/2022 1211) Patient will transfer sit to/from stand:  with supervision  with min guard assist Goal: Pt Will Transfer Bed To Chair/Chair To Bed Outcome: Progressing Flowsheets (Taken 03/27/2022 1211) Pt will Transfer Bed to Chair/Chair to Bed:  min guard assist  with supervision Goal: Pt Will Ambulate Outcome: Progressing Flowsheets (Taken 03/27/2022 1211) Pt will Ambulate:  25 feet  with least restrictive assistive device  with supervision  with min guard assist   Zigmund Gottron, SPT

## 2022-03-27 NOTE — Progress Notes (Signed)
   03/27/22 0712  Provider Notification  Provider Name/Title Dr Tawanna Solo  Date Provider Notified 03/27/22  Time Provider Notified [phone]  Method of Notification  (secure chat)  Notification Reason Other (Comment) (K+ 3.1)

## 2022-03-28 DIAGNOSIS — I10 Essential (primary) hypertension: Secondary | ICD-10-CM | POA: Diagnosis not present

## 2022-03-28 DIAGNOSIS — S42214A Unspecified nondisplaced fracture of surgical neck of right humerus, initial encounter for closed fracture: Secondary | ICD-10-CM | POA: Diagnosis not present

## 2022-03-28 DIAGNOSIS — K219 Gastro-esophageal reflux disease without esophagitis: Secondary | ICD-10-CM | POA: Diagnosis not present

## 2022-03-28 DIAGNOSIS — S72001A Fracture of unspecified part of neck of right femur, initial encounter for closed fracture: Secondary | ICD-10-CM | POA: Diagnosis not present

## 2022-03-28 DIAGNOSIS — F39 Unspecified mood [affective] disorder: Secondary | ICD-10-CM

## 2022-03-28 LAB — CBC
HCT: 32.5 % — ABNORMAL LOW (ref 36.0–46.0)
Hemoglobin: 11 g/dL — ABNORMAL LOW (ref 12.0–15.0)
MCH: 29.6 pg (ref 26.0–34.0)
MCHC: 33.8 g/dL (ref 30.0–36.0)
MCV: 87.4 fL (ref 80.0–100.0)
Platelets: 204 10*3/uL (ref 150–400)
RBC: 3.72 MIL/uL — ABNORMAL LOW (ref 3.87–5.11)
RDW: 15.6 % — ABNORMAL HIGH (ref 11.5–15.5)
WBC: 5.6 10*3/uL (ref 4.0–10.5)
nRBC: 0 % (ref 0.0–0.2)

## 2022-03-28 LAB — BASIC METABOLIC PANEL
Anion gap: 4 — ABNORMAL LOW (ref 5–15)
BUN: 13 mg/dL (ref 8–23)
CO2: 25 mmol/L (ref 22–32)
Calcium: 8 mg/dL — ABNORMAL LOW (ref 8.9–10.3)
Chloride: 105 mmol/L (ref 98–111)
Creatinine, Ser: 0.39 mg/dL — ABNORMAL LOW (ref 0.44–1.00)
GFR, Estimated: >60 mL/min (ref >60)
Glucose, Bld: 124 mg/dL — ABNORMAL HIGH (ref 70–99)
Potassium: 3.3 mmol/L — ABNORMAL LOW (ref 3.5–5.1)
Sodium: 134 mmol/L — ABNORMAL LOW (ref 135–145)

## 2022-03-28 LAB — MAGNESIUM: Magnesium: 1.8 mg/dL (ref 1.7–2.4)

## 2022-03-28 MED ORDER — POTASSIUM CHLORIDE 20 MEQ PO PACK
40.0000 meq | PACK | Freq: Two times a day (BID) | ORAL | Status: AC
  Administered 2022-03-28: 40 meq via ORAL
  Filled 2022-03-28 (×2): qty 2

## 2022-03-28 NOTE — Progress Notes (Signed)
Patient c/o abdominal pressure and unable to urinate. Bladder scan read 430ml. Dr. Clearence Ped on floor and consulted. Order given to place indwelling foley catheter. Foley catheter placed per sterile technique, 533ml of amber, clear urine returned.

## 2022-03-28 NOTE — TOC Progression Note (Signed)
Transition of Care [facility]) - Progression Note    Patient Details  Name: TRIA NOGUERA MRN: [id number] Date of Birth: Jan 07, 1951  [facility] CM/SW Contact  Boneta Lucks, RN Phone Number: 03/28/2022, 12:07 PM  Clinical Narrative:   [facility] is offering, patient's first choice, Patient needs 3 midnight before admission. MD updated.     Expected Discharge Plan: Skilled Nursing Facility Barriers to Discharge: Continued Medical Work up  Expected Discharge Plan and Services Expected Discharge Plan: [place] arrangements for the past 2 months: [place]

## 2022-03-28 NOTE — Progress Notes (Signed)
PROGRESS NOTE    Caitlin Mckinney  EGB:[id number] DOB: Jan 10, 1951 DOA: 03/25/2022 PCP: Abran Richard, MD   Brief Narrative:  Patient is a 71 year old female with history of bone cancer, lung cancer, colon cancer status post colostomy placement, hypertension who presents from home after a fall.  She lives with her family.  She usually walks fine.  She was walking outside with her dog when he pulled on the leash and she lost her balance and fell. Immediately felt sharp pain on her right side. Imaging showed nondisplaced subcapital right femur fracture.  Status post ORIF on 9/28.    Assessment & Plan:   Right hip fracture -S/p  ORIF on 9/28.   -Chemical DVT prophylaxis with lovenox -Continue as needed pain medications.  Monitor H&H closely. -PT/OT recommended SNF   Right Humerus  fracture:  -Imaging showed subtle nondisplaced fracture of the lateral cortex of the humeral neck.   -Management as per orthopedics, currently in sling.  Currently the plan is for conservative management.   History of lung/bone cancer: Currently on chemo and radiation.  Follows with oncology.  CT of right shoulder showed large destructive soft tissue mass at the right sternoclavicular junction compatible with metastasis.   -Will recommend outpatient follow-up with oncology.   Colon cancer: Likely in remission.  Status post colostomy   GERD: Continue Protonix   Mood disorder: Continue Lexapro   Hypertension: Continue amlodipine, atenolol   Hypokalemia: Supplemented with potassium. -Magnesium: WNL   Normocytic anemia: H&H stable.  Continue to monitor   Acute urinary retention: -Bladder scan shows 496 of urine.  Status post Foley catheter placed on 9/30  DVT prophylaxis: Lovenox Code Status: Full code Family Communication:  None present at bedside.  Plan of care discussed with patient in length and she verbalized understanding and agreed with it. Disposition Plan: SNF  Consultants:   Ortho  Procedures:  ORIF  Antimicrobials:  None  Status is: Inpatient    Subjective: Patient seen and examined.  Resting comfortably in the bed.  Requesting something to eat and drink.  She had lower abdominal pain last night, bladder ultrasound shows urinary retention.  Foley catheter placed.  Objective: Vitals:   03/27/22 1411 03/27/22 2014 03/28/22 0646 03/28/22 1032  BP: 111/60 129/66 (!) 142/74 (!) 144/77  Pulse: 68 77 90 75  Resp: 20 20 20 18   Temp: 97.8 F (36.6 C) 98.1 F (36.7 C) 98 F (36.7 C)   TempSrc: Oral Oral Oral   SpO2: 98% 95% 93% 97%  Weight:      Height:        Intake/Output Summary (Last 24 hours) at 03/28/2022 1241 Last data filed at 03/28/2022 1146 Gross per 24 hour  Intake 780 ml  Output 1740 ml  Net -960 ml   Filed Weights   03/25/22 2254 03/26/22 1114 03/26/22 1509  Weight: 56.2 kg 56.2 kg 54 kg    Examination:  General exam: Appears calm and comfortable, on room air, communicating well Respiratory system: Clear to auscultation. Respiratory effort normal. Cardiovascular system: S1 & S2 heard, RRR. No JVD, murmurs, rubs, gallops or clicks. No pedal edema. Gastrointestinal system: Abdomen is nondistended, soft and nontender. No organomegaly or masses felt. Normal bowel sounds heard. Central nervous system: Alert and oriented. No focal neurological deficits. Extremities: Right hip: Dressing dry and intact.  Some tenderness.  No erythema.  Left arm in sling Skin: No rashes, lesions or ulcers    Data Reviewed: I have personally reviewed following labs and  imaging studies  CBC: Recent Labs  Lab 03/26/22 0101 03/26/22 0336 03/26/22 1805 03/27/22 0634 03/28/22 0446  WBC 7.9 6.4 5.3 6.3 5.6  NEUTROABS 6.2 4.7  --   --   --   HGB 12.3 13.0 11.1* 11.5* 11.0*  HCT 37.3 40.1 33.7* 33.8* 32.5*  MCV 87.8 89.1 87.8 87.1 87.4  PLT 258 255 178 175 144   Basic Metabolic Panel: Recent Labs  Lab 03/26/22 0101 03/26/22 0336  03/26/22 1805 03/27/22 0634 03/28/22 0446  NA 135 135  --  134* 134*  K 3.7 3.9  --  3.1* 3.3*  CL 102 102  --  105 105  CO2 24 25  --  26 25  GLUCOSE 117* 122*  --  115* 124*  BUN 13 14  --  12 13  CREATININE 0.67 0.58 0.46 0.42* 0.39*  CALCIUM 8.8* 9.0  --  8.1* 8.0*  MG  --  2.0  --   --  1.8   GFR: Estimated Creatinine Clearance: 45.7 mL/min (A) (by C-G formula based on SCr of 0.39 mg/dL (L)). Liver Function Tests: Recent Labs  Lab 03/26/22 0336  AST 35  ALT 26  ALKPHOS 118  BILITOT 1.1  PROT 6.9  ALBUMIN 3.5   No results for input(s): "LIPASE", "AMYLASE" in the last 168 hours. No results for input(s): "AMMONIA" in the last 168 hours. Coagulation Profile: Recent Labs  Lab 03/26/22 0101  INR 1.1   Cardiac Enzymes: No results for input(s): "CKTOTAL", "CKMB", "CKMBINDEX", "TROPONINI" in the last 168 hours. BNP (last 3 results) No results for input(s): "PROBNP" in the last 8760 hours. HbA1C: No results for input(s): "HGBA1C" in the last 72 hours. CBG: No results for input(s): "GLUCAP" in the last 168 hours. Lipid Profile: No results for input(s): "CHOL", "HDL", "LDLCALC", "TRIG", "CHOLHDL", "LDLDIRECT" in the last 72 hours. Thyroid Function Tests: No results for input(s): "TSH", "T4TOTAL", "FREET4", "T3FREE", "THYROIDAB" in the last 72 hours. Anemia Panel: No results for input(s): "VITAMINB12", "FOLATE", "FERRITIN", "TIBC", "IRON", "RETICCTPCT" in the last 72 hours. Sepsis Labs: No results for input(s): "PROCALCITON", "LATICACIDVEN" in the last 168 hours.  No results found for this or any previous visit (from the past 240 hour(s)).    Radiology Studies: DG HIP UNILAT WITH PELVIS 2-3 VIEWS RIGHT  Result Date: 03/26/2022 CLINICAL DATA:  Postop internal fixation of right hip. EXAM: DG HIP (WITH OR WITHOUT PELVIS) 2-3V RIGHT COMPARISON:  Intraoperative fluoroscopic images 03/26/2022. Right hip radiographs 03/25/2022 and CT 03/26/2022. FINDINGS: ORIF of the right  femur is noted with screws traversing the femoral neck and terminating in the head. Alignment appears anatomic. The femoral head is approximated with the acetabulum. Skin staples are noted lateral to the hip, and there are surgical clips in the pelvis. IMPRESSION: ORIF of the right femur as above. Electronically Signed   By: Logan Bores M.D.   On: 03/26/2022 14:33   DG HIP UNILAT WITH PELVIS 2-3 VIEWS RIGHT  Result Date: 03/26/2022 CLINICAL DATA:  Fluoroscopic assistance for internal fixation of fracture of neck of right femur EXAM: DG HIP (WITH OR WITHOUT PELVIS) 2-3V RIGHT COMPARISON:  CT done on 03/25/2022 FINDINGS: Fluoroscopic images show internal fixation of fracture of neck of right femur with 3 surgical screws. Fluoroscopic time 50 seconds. Radiation dose 9.81 mGy. IMPRESSION: Fluoroscopic assistance was provided for internal fixation of fracture of neck of right femur. Electronically Signed   By: Elmer Picker M.D.   On: 03/26/2022 14:05   DG C-Arm  1-60 Min-No Report  Result Date: 03/26/2022 Fluoroscopy was utilized by the requesting physician.  No radiographic interpretation.    Scheduled Meds:  amLODipine  5 mg Oral Daily   atenolol  50 mg Oral Daily   Chlorhexidine Gluconate Cloth  6 each Topical Daily   docusate sodium  100 mg Oral BID   enoxaparin (LOVENOX) injection  40 mg Subcutaneous Q24H   escitalopram  20 mg Oral Daily   pantoprazole  40 mg Oral Daily   potassium chloride  40 mEq Oral BID   traMADol  50 mg Oral Once   Continuous Infusions:  methocarbamol (ROBAXIN) IV       LOS: 2 days   Time spent: 35 minutes   Jase Reep Loann Quill, MD [facility]  If 7PM-7AM, please contact night-coverage [email or web address] 03/28/2022, 12:41 PM

## 2022-03-29 DIAGNOSIS — S72001A Fracture of unspecified part of neck of right femur, initial encounter for closed fracture: Secondary | ICD-10-CM | POA: Diagnosis not present

## 2022-03-29 DIAGNOSIS — K219 Gastro-esophageal reflux disease without esophagitis: Secondary | ICD-10-CM | POA: Diagnosis not present

## 2022-03-29 DIAGNOSIS — I10 Essential (primary) hypertension: Secondary | ICD-10-CM | POA: Diagnosis not present

## 2022-03-29 DIAGNOSIS — S42214A Unspecified nondisplaced fracture of surgical neck of right humerus, initial encounter for closed fracture: Secondary | ICD-10-CM | POA: Diagnosis not present

## 2022-03-29 LAB — BASIC METABOLIC PANEL
Anion gap: 5 (ref 5–15)
BUN: 7 mg/dL — ABNORMAL LOW (ref 8–23)
CO2: 26 mmol/L (ref 22–32)
Calcium: 8 mg/dL — ABNORMAL LOW (ref 8.9–10.3)
Chloride: 101 mmol/L (ref 98–111)
Creatinine, Ser: 0.42 mg/dL — ABNORMAL LOW (ref 0.44–1.00)
GFR, Estimated: >60 mL/min (ref >60)
Glucose, Bld: 100 mg/dL — ABNORMAL HIGH (ref 70–99)
Potassium: 3.7 mmol/L (ref 3.5–5.1)
Sodium: 132 mmol/L — ABNORMAL LOW (ref 135–145)

## 2022-03-29 LAB — CBC
HCT: 34.4 % — ABNORMAL LOW (ref 36.0–46.0)
Hemoglobin: 11.6 g/dL — ABNORMAL LOW (ref 12.0–15.0)
MCH: 29.5 pg (ref 26.0–34.0)
MCHC: 33.7 g/dL (ref 30.0–36.0)
MCV: 87.5 fL (ref 80.0–100.0)
Platelets: 245 10*3/uL (ref 150–400)
RBC: 3.93 MIL/uL (ref 3.87–5.11)
RDW: 15.7 % — ABNORMAL HIGH (ref 11.5–15.5)
WBC: 5.4 10*3/uL (ref 4.0–10.5)
nRBC: 0 % (ref 0.0–0.2)

## 2022-03-29 LAB — MAGNESIUM: Magnesium: 1.9 mg/dL (ref 1.7–2.4)

## 2022-03-29 NOTE — Progress Notes (Signed)
PROGRESS NOTE    Caitlin Mckinney  HGD:[id number] DOB: 09-Jul-1950 DOA: 03/25/2022 PCP: Abran Richard, MD   Brief Narrative:  Patient is a 71 year old female with history of bone cancer, lung cancer, colon cancer status post colostomy placement, hypertension who presents from home after a fall.  She lives with her family.  She usually walks fine.  She was walking outside with her dog when he pulled on the leash and she lost her balance and fell. Immediately felt sharp pain on her right side. Imaging showed nondisplaced subcapital right femur fracture.  Status post ORIF on 9/28.    Assessment & Plan:   Right hip fracture -S/p  ORIF on 9/28.   -Chemical DVT prophylaxis with lovenox per Ortho -Continue as needed pain medications.  Monitor H&H closely. -PT/OT recommended SNF-await placement -Ortho recommended WBAT, Lovenox at the time of discharge.  Follow-up in 2 weeks outpatient   Right Humerus  fracture:  -Imaging showed subtle nondisplaced fracture of the lateral cortex of the humeral neck.   -Management as per orthopedics, currently in sling.  Currently the plan is for conservative management.   History of lung/bone cancer: Currently on chemo and radiation.  Follows with oncology.  CT of right shoulder showed large destructive soft tissue mass at the right sternoclavicular junction compatible with metastasis.   -Will recommend outpatient follow-up with oncology.   Colon cancer: Likely in remission.  Status post colostomy   GERD: Continue Protonix   Mood disorder: Continue Lexapro   Hypertension: Continue amlodipine, atenolol   Hypokalemia: Supplemented with potassium. -Magnesium: WNL   Normocytic anemia: H&H stable.  Continue to monitor   Acute urinary retention: -Bladder scan showed 496 of urine.  Status post Foley catheter placed on 9/30 -Need out patient urology follow-up  DVT prophylaxis: Lovenox Code Status: Full code Family Communication:  None present at  bedside.  Plan of care discussed with patient in length and she verbalized understanding and agreed with it. Disposition Plan: SNF  Consultants:  Ortho  Procedures:  ORIF  Antimicrobials:  None  Status is: Inpatient    Subjective: Patient seen and examined.  Resting comfortably on the bed.  Denies any new complaints.  No nausea or vomiting.  She has some stool in colostomy bag.  No acute events overnight.  Objective: Vitals:   03/28/22 1032 03/28/22 1401 03/28/22 1708 03/28/22 2056  BP: (!) 144/77 129/83 136/60 (!) 144/72  Pulse: 75 75 79 82  Resp: 18 19 20 20   Temp:  98.1 F (36.7 C) 98.5 F (36.9 C) 98.5 F (36.9 C)  TempSrc:  Oral Oral Oral  SpO2: 97% 95% 99% 96%  Weight:      Height:        Intake/Output Summary (Last 24 hours) at 03/29/2022 1205 Last data filed at 03/28/2022 2034 Gross per 24 hour  Intake --  Output 1300 ml  Net -1300 ml    Filed Weights   03/25/22 2254 03/26/22 1114 03/26/22 1509  Weight: 56.2 kg 56.2 kg 54 kg    Examination:  General exam: Appears calm and comfortable, on room air, communicating well Respiratory system: Clear to auscultation. Respiratory effort normal. Cardiovascular system: S1 & S2 heard, RRR. No JVD, murmurs, rubs, gallops or clicks. No pedal edema. Gastrointestinal system: Abdomen is nondistended, soft and nontender. No organomegaly or masses felt. Normal bowel sounds heard. Central nervous system: Alert and oriented. No focal neurological deficits. Extremities: Right hip: Dressing dry and intact.  Some tenderness.  No erythema.  Right  arm in sling Skin: No rashes, lesions or ulcers    Data Reviewed: I have personally reviewed following labs and imaging studies  CBC: Recent Labs  Lab 03/26/22 0101 03/26/22 0336 03/26/22 1805 03/27/22 0634 03/28/22 0446 03/29/22 0512  WBC 7.9 6.4 5.3 6.3 5.6 5.4  NEUTROABS 6.2 4.7  --   --   --   --   HGB 12.3 13.0 11.1* 11.5* 11.0* 11.6*  HCT 37.3 40.1 33.7* 33.8*  32.5* 34.4*  MCV 87.8 89.1 87.8 87.1 87.4 87.5  PLT 258 255 178 175 204 361    Basic Metabolic Panel: Recent Labs  Lab 03/26/22 0101 03/26/22 0336 03/26/22 1805 03/27/22 0634 03/28/22 0446 03/29/22 0512  NA 135 135  --  134* 134* 132*  K 3.7 3.9  --  3.1* 3.3* 3.7  CL 102 102  --  105 105 101  CO2 24 25  --  26 25 26   GLUCOSE 117* 122*  --  115* 124* 100*  BUN 13 14  --  12 13 7*  CREATININE 0.67 0.58 0.46 0.42* 0.39* 0.42*  CALCIUM 8.8* 9.0  --  8.1* 8.0* 8.0*  MG  --  2.0  --   --  1.8 1.9    GFR: Estimated Creatinine Clearance: 45.7 mL/min (A) (by C-G formula based on SCr of 0.42 mg/dL (L)). Liver Function Tests: Recent Labs  Lab 03/26/22 0336  AST 35  ALT 26  ALKPHOS 118  BILITOT 1.1  PROT 6.9  ALBUMIN 3.5    No results for input(s): "LIPASE", "AMYLASE" in the last 168 hours. No results for input(s): "AMMONIA" in the last 168 hours. Coagulation Profile: Recent Labs  Lab 03/26/22 0101  INR 1.1    Cardiac Enzymes: No results for input(s): "CKTOTAL", "CKMB", "CKMBINDEX", "TROPONINI" in the last 168 hours. BNP (last 3 results) No results for input(s): "PROBNP" in the last 8760 hours. HbA1C: No results for input(s): "HGBA1C" in the last 72 hours. CBG: No results for input(s): "GLUCAP" in the last 168 hours. Lipid Profile: No results for input(s): "CHOL", "HDL", "LDLCALC", "TRIG", "CHOLHDL", "LDLDIRECT" in the last 72 hours. Thyroid Function Tests: No results for input(s): "TSH", "T4TOTAL", "FREET4", "T3FREE", "THYROIDAB" in the last 72 hours. Anemia Panel: No results for input(s): "VITAMINB12", "FOLATE", "FERRITIN", "TIBC", "IRON", "RETICCTPCT" in the last 72 hours. Sepsis Labs: No results for input(s): "PROCALCITON", "LATICACIDVEN" in the last 168 hours.  No results found for this or any previous visit (from the past 240 hour(s)).    Radiology Studies: No results found.  Scheduled Meds:  amLODipine  5 mg Oral Daily   atenolol  50 mg Oral Daily    Chlorhexidine Gluconate Cloth  6 each Topical Daily   docusate sodium  100 mg Oral BID   enoxaparin (LOVENOX) injection  40 mg Subcutaneous Q24H   escitalopram  20 mg Oral Daily   pantoprazole  40 mg Oral Daily   traMADol  50 mg Oral Once   Continuous Infusions:  methocarbamol (ROBAXIN) IV       LOS: 3 days   Time spent: 35 minutes   Nikisha Fleece Loann Quill, MD [facility]  If 7PM-7AM, please contact night-coverage [email or web address] 03/29/2022, 12:05 PM

## 2022-03-30 DIAGNOSIS — S72001A Fracture of unspecified part of neck of right femur, initial encounter for closed fracture: Secondary | ICD-10-CM | POA: Diagnosis not present

## 2022-03-30 LAB — BASIC METABOLIC PANEL
Anion gap: 9 (ref 5–15)
BUN: 10 mg/dL (ref 8–23)
CO2: 26 mmol/L (ref 22–32)
Calcium: 8.3 mg/dL — ABNORMAL LOW (ref 8.9–10.3)
Chloride: 99 mmol/L (ref 98–111)
Creatinine, Ser: 0.35 mg/dL — ABNORMAL LOW (ref 0.44–1.00)
GFR, Estimated: >60 mL/min (ref >60)
Glucose, Bld: 112 mg/dL — ABNORMAL HIGH (ref 70–99)
Potassium: 3.5 mmol/L (ref 3.5–5.1)
Sodium: 134 mmol/L — ABNORMAL LOW (ref 135–145)

## 2022-03-30 LAB — CBC
HCT: 33.1 % — ABNORMAL LOW (ref 36.0–46.0)
Hemoglobin: 11 g/dL — ABNORMAL LOW (ref 12.0–15.0)
MCH: 29.1 pg (ref 26.0–34.0)
MCHC: 33.2 g/dL (ref 30.0–36.0)
MCV: 87.6 fL (ref 80.0–100.0)
Platelets: 253 10*3/uL (ref 150–400)
RBC: 3.78 MIL/uL — ABNORMAL LOW (ref 3.87–5.11)
RDW: 15.5 % (ref 11.5–15.5)
WBC: 4.3 10*3/uL (ref 4.0–10.5)
nRBC: 0 % (ref 0.0–0.2)

## 2022-03-30 MED ORDER — ENOXAPARIN SODIUM 40 MG/0.4ML IJ SOSY: 40.0000 mg | PREFILLED_SYRINGE | INTRAMUSCULAR | Status: AC

## 2022-03-30 MED ORDER — HYDROMORPHONE HCL 2 MG PO TABS: 2.0000 mg | ORAL_TABLET | ORAL | 0 refills | Status: AC | PRN

## 2022-03-30 MED ORDER — FENTANYL 25 MCG/HR TD PT72: 1.0000 | MEDICATED_PATCH | TRANSDERMAL | 0 refills | Status: AC

## 2022-03-30 MED ORDER — HEPARIN SOD (PORK) LOCK FLUSH 100 UNIT/ML IV SOLN
500.0000 [IU] | Freq: Once | INTRAVENOUS | Status: AC
  Administered 2022-03-30: 500 [IU] via INTRAVENOUS
  Filled 2022-03-30: qty 5

## 2022-03-30 NOTE — TOC Transition Note (Signed)
Transition of Care [facility]) - CM/SW Discharge Note   Patient Details  Name: ANNEBELLE BOSTIC MRN: [id number] Date of Birth: 1950-10-15  Transition of Care [facility]) CM/SW Contact:  Boneta Lucks, RN Phone Number: 03/30/2022, 2:04 PM   Clinical Narrative:   Patient discharging to [facility]. Med necessity printed. TOC will call EMS when RN is ready. DC summary sent in the hub.    Final next level of care: Acute to Acute Transfer Barriers to Discharge: Barriers Resolved  Patient Goals and CMS Choice Patient states their goals for this hospitalization and ongoing recovery are:: agreeable to SNF CMS Medicare.gov Compare Post Acute Care list provided to:: Patient Choice offered to / list presented to : Patient  Discharge Placement              Patient to be transferred to facility by: EMS Name of family member notified: Patient called family Patient and family notified of of transfer: 03/30/22            Readmission Risk Interventions    03/27/2022    1:21 PM  Readmission Risk Prevention Plan  Post Dischage Appt Not Complete  Medication Screening Complete  Transportation Screening Complete

## 2022-03-30 NOTE — Care Management Important Message (Signed)
Important Message  Patient Details  Name: Caitlin Mckinney MRN: [id number] Date of Birth: 12-Dec-1950   Medicare Important Message Given:  Yes (spoke with at [phone] to review letter, no additonal copy needed)     Tommy Medal 03/30/2022, 1:33 PM

## 2022-03-30 NOTE — Hospital Course (Addendum)
71 year old female with history of bone cancer, lung cancer, colon cancer status post colostomy placement, hypertension who presents from home after a fall.  She lives with her family.  She usually walks fine.  She was walking outside with her dog when he pulled on the leash and she lost her balance and fell. Immediately felt sharp pain on her right side.Imaging showed nondisplaced subcapital right femur fracture.  Status post ORIF on 9/28.  Seen by orthopedics at this time still for discharge to skilled nursing facility, PT OT working with her and advised skilled nursing facility.  Continue pain control, WBAT, Lovenox x 4 wks for DVT prophylaxis and outpatient follow-up with orthopedics in 2 weeks.

## 2022-03-30 NOTE — Progress Notes (Signed)
Physical Therapy Treatment Patient Details Name: Caitlin Mckinney MRN: [id number] DOB: April 27, 1951 Today's Date: 03/30/2022   History of Present Illness Caitlin Mckinney is a 71 y.o. female with medical history significant of bone cancer, lung cancer, colon cancer, and hypertension presents to the ED with a chief complaint of fall.  Patient reports that she was outside walking her big dog when he pulled on the leash and she lost her balance and fell straight back.  Patient reports that she had immediate sharp pain on her right side.  She reports that it was at times gnawing.  Even with help from 2 other people she could not get up, and they had to call EMS to bring her in.  She reports that pain is still present if she moves her leg.  It is better with rest.  She has no chest pain, no palpitations.  Patient does not wear oxygen at home but is wearing oxygen at time of admission because she had oxygen sats drop after her dose of Dilaudid in the ED.  Patient denies any loss of consciousness.  She denies any blood thinners.  Patient is currently under the care of oncology and gets chemo.  Her last chemo was last Tuesday.  She finished her course of 10 radiation treatments approximately 3 weeks ago.  Prior to her fall patient was in her normal state of health.    PT Comments    Patient now modified independent with bed mobility, but still limited to a few side steps to transfer to chair with quad cane mostly due to fatigue and pain with weightbearing through RLE. Patient requires verbal cueing to put weight through cane to offload RLE when stepping to decrease discomfort. Patient tolerated sitting up in chair after therapy. Patient will benefit from continued skilled physical therapy in hospital and recommended venue below to increase strength, balance, endurance for safe ADLs and gait.    Recommendations for follow up therapy are one component of a multi-disciplinary discharge planning process, led by the  attending physician.  Recommendations may be updated based on patient status, additional functional criteria and insurance authorization.  Follow Up Recommendations  Skilled nursing-short term rehab (<3 hours/day) Can patient physically be transported by private vehicle: No   Assistance Recommended at Discharge Set up Supervision/Assistance  Patient can return home with the following A lot of help with walking and/or transfers;A lot of help with bathing/dressing/bathroom;Assistance with cooking/housework;Help with stairs or ramp for entrance   Equipment Recommendations  Cane (quad cane)    Recommendations for Other Services       Precautions / Restrictions Precautions Precautions: Fall Required Braces or Orthoses: Sling Restrictions Weight Bearing Restrictions: Yes RLE Weight Bearing: Weight bearing as tolerated     Mobility  Bed Mobility Overal bed mobility: Modified Independent Bed Mobility: Supine to Sit     Supine to sit: Modified independent (Device/Increase time), Supervision     General bed mobility comments: Patient not requiring any assist for supine to sit transfer today, increased time with labored movement    Transfers Overall transfer level: Needs assistance Equipment used: Quad cane Transfers: Sit to/from Stand, Bed to chair/wheelchair/BSC Sit to Stand: Min guard, Min assist   Step pivot transfers: Min assist, Min guard       General transfer comment: use of quad cane, labored movement, required verbal cueing for putting most weight through LLE/UE    Ambulation/Gait Ambulation/Gait assistance: Min guard, Min assist Gait Distance (Feet): 5 Feet Assistive device: [employer]  cane Gait Pattern/deviations: Decreased step length - right, Decreased step length - left, Decreased stance time - right, Decreased stride length Gait velocity: decreased     General Gait Details: Patient limited to a few side steps to chair, limited mostly due to generalized weakness  and discomfort bearing weight through R LE   Stairs             Wheelchair Mobility    Modified Rankin (Stroke Patients Only)       Balance Overall balance assessment: Needs assistance Sitting-balance support: Single extremity supported, Feet supported Sitting balance-Leahy Scale: Good Sitting balance - Comments: fair/good seated EOB   Standing balance support: Single extremity supported, During functional activity, Reliant on assistive device for balance Standing balance-Leahy Scale: Fair Standing balance comment: Patient unsteady on feet, requiring verbal cueing to bear weight through LUE with quad cane to offload LLE                            Cognition Arousal/Alertness: Awake/alert Behavior During Therapy: WFL for tasks assessed/performed Overall Cognitive Status: Within Functional Limits for tasks assessed                                          Exercises General Exercises - Lower Extremity Long Arc Quad: AROM, Both, 20 reps, Seated Hip Flexion/Marching: AROM, 15 reps, Both, Seated Toe Raises: AROM, Both, 20 reps, Seated Heel Raises: AROM, 20 reps, Both, Seated    General Comments        Pertinent Vitals/Pain Pain Assessment Pain Assessment: 0-10 Pain Score: 8  Pain Location: right knee Pain Descriptors / Indicators: Sore Pain Intervention(s): Limited activity within patient's tolerance, Monitored during session, Repositioned, Premedicated before session    Home Living                          Prior Function            PT Goals (current goals can now be found in the care plan section) Acute Rehab PT Goals Patient Stated Goal: return home with family after rehab PT Goal Formulation: With patient Time For Goal Achievement: 04/06/22 Potential to Achieve Goals: Good Progress towards PT goals: Progressing toward goals    Frequency    Min 3X/week      PT Plan      Co-evaluation               AM-PAC PT "6 Clicks" Mobility   Outcome Measure  Help needed turning from your back to your side while in a flat bed without using bedrails?: None Help needed moving from lying on your back to sitting on the side of a flat bed without using bedrails?: A Little Help needed moving to and from a bed to a chair (including a wheelchair)?: A Lot Help needed standing up from a chair using your arms (e.g., wheelchair or bedside chair)?: A Little Help needed to walk in hospital room?: A Lot Help needed climbing 3-5 steps with a railing? : Total 6 Click Score: 15    End of Session   Activity Tolerance: Patient tolerated treatment well;Patient limited by fatigue;Patient limited by pain Patient left: in chair;with call bell/phone within reach Nurse Communication: Mobility status PT Visit Diagnosis: Unsteadiness on feet (R26.81);Other abnormalities of gait and mobility (R26.89);Muscle weakness (generalized) (M62.81)  Time: 3912-2583 PT Time Calculation (min) (ACUTE ONLY): 24 min  Charges:  $Therapeutic Exercise: 8-22 mins $Therapeutic Activity: 8-22 mins                     Zigmund Gottron, SPT

## 2022-03-30 NOTE — Plan of Care (Signed)

## 2022-03-30 NOTE — Progress Notes (Signed)
Patient ID: Caitlin Mckinney, female   DOB: 08/29/1950, 71 y.o.   MRN: [id number] Status post ORIF right hip for femoral neck fracture  Patient is stable condition  Recommend discharge to SNF most likely.

## 2022-03-30 NOTE — Discharge Summary (Signed)
Physician Discharge Summary  Caitlin Mckinney:[id number] DOB: 09/09/1950 DOA: 03/25/2022  PCP: Abran Richard, MD  Admit date: 03/25/2022 Discharge date: 03/30/2022 Recommendations for Outpatient Follow-up:  Follow up with PCP in 1 weeks-call for appointment Please obtain BMP/CBC in one week Do voiding trial at the facility in a week, follow-up with urology as outpatient due to urine retention and Foley catheterization Follow-up with orthopedic surgery for postop follow-up care  Discharge Dispo: SNF Discharge Condition: Stable Code Status:   Code Status: Full Code Diet recommendation:  Diet Order             Diet regular Room service appropriate? Yes; Fluid consistency: Thin  Diet effective now                    Brief/Interim Summary: 71 year old female with history of bone cancer, lung cancer, colon cancer status post colostomy placement, hypertension who presents from home after a fall.  She lives with her family.  She usually walks fine.  She was walking outside with her dog when he pulled on the leash and she lost her balance and fell. Immediately felt sharp pain on her right side.Imaging showed nondisplaced subcapital right femur fracture.  Status post ORIF on 9/28.  Seen by orthopedics at this time still for discharge to skilled nursing facility, PT OT working with her and advised skilled nursing facility.  Continue pain control, WBAT, Lovenox x 4 wks for DVT prophylaxis and outpatient follow-up with orthopedics in 2 weeks.   Discharge Diagnoses:  Principal Problem:   Hip fracture ([place]) Active Problems:   Essential hypertension   Mood disorder (HCC)   GERD (gastroesophageal reflux disease)   Humeral surgical neck fracture   Traumatic closed nondisplaced fracture of proximal end of right humerus Right hip fracture:Status post ORIF on 9/28.  Seen by orthopedics at this time still for discharge to skilled nursing facility, PT OT working with her and advised skilled  nursing facility.  Continue pain control, WBAT, Lovenox x 4 wks for DVT prophylaxis and outpatient follow-up with orthopedics in 2 weeks.  Discussed with orthopedics this morning and okay for discharge on Lovenox.  Right humerus fracture-not displaced on conservative management with sling and outpatient orthopedic follow-up  History of lung/bone cancer: Currently on chemo and radiation.  Follows with oncology.  CT of right shoulder showed large destructive soft tissue mass at the right sternoclavicular junction compatible with metastasis.Recommend outpatient follow-up with oncology.   Colon cancer: Likely in remission.  Status post colostomy GERD: Continue Protonix Mood disorder: Continue Lexapro Hypertension: Controlled on amlodipine, atenolol Hypokalemia: Repleted.   Normocytic anemia: H&H stable.  Continue to monitor  Acute urinary retention:-Bladder scan showed 496 of urine.  Status post Foley catheter placed on 9/30 as patient failed Foley removal.  Will discharge with Foley catheter she will need to follow up with urology, do trial of voiding and out with at SNF  Consults: Orthopedic surgery Subjective: Alert awake oriented resting comfortably pain is controlled. Agreeable with rehab discharge.    Discharge Exam: Vitals:   03/29/22 1947 03/30/22 0347  BP: (!) 143/72 132/66  Pulse: 74 68  Resp: 16 20  Temp: 98.4 F (36.9 C) 97.8 F (36.6 C)  SpO2: 98% 97%   General: Pt is alert, awake, not in acute distress Cardiovascular: RRR, S1/S2 +, no rubs, no gallops Respiratory: CTA bilaterally, no wheezing, no rhonchi Abdominal: Soft, NT, ND, bowel sounds + Extremities: no edema, no cyanosis  Discharge Instructions  Discharge Instructions  Discharge instructions   Complete by: As directed    Do voiding trial at the facility in a week, follow-up with urology as outpatient due to urine retention and Foley catheterization  Follow-up with orthopedic surgery for postop  follow-up care  Please call call MD or return to ER for similar or worsening recurring problem that brought you to hospital or if any fever,nausea/vomiting,abdominal pain, uncontrolled pain, chest pain,  shortness of breath or any other alarming symptoms.    Please follow-up your doctor as instructed in a week time and call the office for appointment.  Please avoid alcohol, smoking, or any other illicit substance and maintain healthy habits including taking your regular medications as prescribed.  You were cared for by a hospitalist during your hospital stay. If you have any questions about your discharge medications or the care you received while you were in the hospital after you are discharged, you can call the unit and ask to speak with the hospitalist on call if the hospitalist that took care of you is not available.  Once you are discharged, your primary care physician will handle any further medical issues. Please note that NO REFILLS for any discharge medications will be authorized once you are discharged, as it is imperative that you return to your primary care physician (or establish a relationship with a primary care physician if you do not have one) for your aftercare needs so that they can reassess your need for medications and monitor your lab values   Discharge wound care:   Complete by: As directed    Reinforce dressing as needed need orthopedic evaluation for dressing changes in the office in a week   Increase activity slowly   Complete by: As directed       Allergies as of 03/30/2022       Reactions   Contrast Media [iodinated Contrast Media] Shortness Of Breath   Other    States her blood pressure medicine made her cough    Allevess [capsaicin-menthol] Rash   Erythromycin Rash   Naproxen Rash        Medication List     TAKE these medications    amLODipine 5 MG tablet Commonly known as: NORVASC Take 5 mg by mouth daily.   Artificial Tears 0.4 %  Soln Generic drug: Hypromellose Apply 2 drops to eye 3 (three) times daily.   atenolol 50 MG tablet Commonly known as: TENORMIN Take 50 mg by mouth daily.   docusate sodium 100 MG capsule Commonly known as: COLACE Take 1 capsule (100 mg total) by mouth every 12 (twelve) hours.   enoxaparin 40 MG/0.4ML injection Commonly known as: LOVENOX Inject 0.4 mLs (40 mg total) into the skin daily for 28 days. Start taking on: March 31, 2022   escitalopram 20 MG tablet Commonly known as: LEXAPRO Take 20 mg by mouth daily.   fentaNYL 25 MCG/HR Commonly known as: [place] 1 patch onto the skin every 3 (three) days for 1 dose.   fluticasone 50 MCG/ACT nasal spray Commonly known as: FLONASE Place 1 spray into both nostrils daily as needed for allergies or rhinitis.   HYDROmorphone 2 MG tablet Commonly known as: DILAUDID Take 1 tablet (2 mg total) by mouth every 3 (three) hours as needed for up to 3 doses for moderate pain.   loratadine 10 MG tablet Commonly known as: CLARITIN Take 10 mg by mouth daily.   Magnesium 250 MG Tabs Take 250-500 mg by mouth at bedtime.   omeprazole  20 MG capsule Commonly known as: PRILOSEC Take 1 capsule by mouth daily.   polyethylene glycol 17 g packet Commonly known as: MIRALAX / GLYCOLAX Take 17 g by mouth 2 (two) times daily as needed.               Discharge Care Instructions  (From admission, onward)           Start     Ordered   03/30/22 0000  Discharge wound care:       Comments: Reinforce dressing as needed need orthopedic evaluation for dressing changes in the office in a week   03/30/22 1247            Contact information for follow-up providers     Abran Richard, MD Follow up in 1 week(s).   Specialty: Internal Medicine Contact information: [address] [place] [address] [phone]         Carole Civil, MD Follow up in 1 week(s).   Specialties: Orthopedic Surgery,  Radiology Contact information: [address] [place] [address] [phone]              Contact information for after-discharge care     [facility] .   Service: Skilled Nursing Contact information: [place] [phone]                    Allergies  Allergen Reactions   Contrast Media [Iodinated Contrast Media] Shortness Of Breath   Other     States her blood pressure medicine made her cough    Allevess [Capsaicin-Menthol] Rash   Erythromycin Rash   Naproxen Rash    The results of significant diagnostics from this hospitalization (including imaging, microbiology, ancillary and laboratory) are listed below for reference.    Microbiology: No results found for this or any previous visit (from the past 240 hour(s)).  Procedures/Studies: DG HIP UNILAT WITH PELVIS 2-3 VIEWS RIGHT  Result Date: 03/26/2022 CLINICAL DATA:  Postop internal fixation of right hip. EXAM: DG HIP (WITH OR WITHOUT PELVIS) 2-3V RIGHT COMPARISON:  Intraoperative fluoroscopic images 03/26/2022. Right hip radiographs 03/25/2022 and CT 03/26/2022. FINDINGS: ORIF of the right femur is noted with screws traversing the femoral neck and terminating in the head. Alignment appears anatomic. The femoral head is approximated with the acetabulum. Skin staples are noted lateral to the hip, and there are surgical clips in the pelvis. IMPRESSION: ORIF of the right femur as above. Electronically Signed   By: Logan Bores M.D.   On: 03/26/2022 14:33   DG HIP UNILAT WITH PELVIS 2-3 VIEWS RIGHT  Result Date: 03/26/2022 CLINICAL DATA:  Fluoroscopic assistance for internal fixation of fracture of neck of right femur EXAM: DG HIP (WITH OR WITHOUT PELVIS) 2-3V RIGHT COMPARISON:  CT done on 03/25/2022 FINDINGS: Fluoroscopic images show internal fixation of fracture of neck of right femur with 3 surgical screws.  Fluoroscopic time 50 seconds. Radiation dose 9.81 mGy. IMPRESSION: Fluoroscopic assistance was provided for internal fixation of fracture of neck of right femur. Electronically Signed   By: Elmer Picker M.D.   On: 03/26/2022 14:05   DG C-Arm 1-60 Min-No Report  Result Date: 03/26/2022 Fluoroscopy was utilized by the requesting physician.  No radiographic interpretation.   CT Hip Right Wo Contrast  Result Date: 03/26/2022 CLINICAL DATA:  Hip trauma after mechanical fall EXAM: CT OF THE RIGHT HIP WITHOUT CONTRAST TECHNIQUE:  Multidetector CT imaging of the right hip was performed according to the standard protocol. Multiplanar CT image reconstructions were also generated. RADIATION DOSE REDUCTION: This exam was performed according to the departmental dose-optimization program which includes automated exposure control, adjustment of the mA and/or kV according to patient size and/or use of iterative reconstruction technique. COMPARISON:  Radiographs 03/25/2022 FINDINGS: Bones/Joint/Cartilage Nondisplaced subcapital right femur fracture with fracture line extending posteriorly into the greater trochanter and inferiorly along the medial femoral metadiaphysis. No dislocation. Ligaments Suboptimally assessed by CT. Muscles and Tendons Calcifications within the right proximal hamstring likely due to tendinopathy. Soft tissues Redemonstrated nodularity about the surgical clips in the posterior right pelvis. IMPRESSION: Nondisplaced subcapital right femur fracture with fracture line extending into the greater trochanter and lesser trochanters. Redemonstrated nodular thickening in the posterior pelvis about the surgical clips as seen on CT abdomen and pelvis 02/19/2022. Electronically Signed   By: Placido Sou M.D.   On: 03/26/2022 00:29   CT SHOULDER RIGHT WO CONTRAST  Result Date: 03/26/2022 CLINICAL DATA:  Upper arm trauma from mechanical fall with right shoulder pain EXAM: CT OF THE UPPER RIGHT  EXTREMITY WITHOUT CONTRAST TECHNIQUE: Multidetector CT imaging of the upper right extremity was performed according to the standard protocol. RADIATION DOSE REDUCTION: This exam was performed according to the departmental dose-optimization program which includes automated exposure control, adjustment of the mA and/or kV according to patient size and/or use of iterative reconstruction technique. COMPARISON:  Shoulder radiographs 03/25/2022 FINDINGS: Bones/Joint/Cartilage Cortical step-off about the lateral humeral neck corresponds to the abnormality on radiographs earlier today and is compatible with nondisplaced fracture (see series 9/image 27 and series 5/image 46). No additional fracture identified. No dislocation. Large destructive soft tissue mass measuring 6.6 x 3.6 cm at the right sternoclavicular junction with destruction of the proximal clavicle and superior aspect of the anterior first rib. Ligaments Suboptimally assessed by CT. Muscles and Tendons Calcific densities posterior to the humerus likely due to infraspinatus tendinopathy. Soft tissues Numerous pulmonary nodules some of which are cavitary in the right lung compatible with metastases. IMPRESSION: Subtle nondisplaced fracture of the lateral cortex of the humeral neck. Large destructive soft tissue mass at the right sternoclavicular junction compatible with metastasis. Multiple pulmonary metastases. Electronically Signed   By: Placido Sou M.D.   On: 03/26/2022 00:18   DG Shoulder Right  Result Date: 03/25/2022 CLINICAL DATA:  Fall, pain EXAM: RIGHT SHOULDER - 2+ VIEW COMPARISON:  None Available. FINDINGS: There is cortical irregularity noted in the lateral humeral neck region. No well-defined fracture crosses the humeral neck, but findings are suspicious for occult fracture. No subluxation or dislocation. Numerous pulmonary nodules and masses within the right lung. IMPRESSION: Cortical irregularity laterally in the region of the humeral neck.  Findings concerning for subtle or occult humeral neck fracture. Consider further evaluation with CT. Numerous pulmonary metastases. Electronically Signed   By: Rolm Baptise M.D.   On: 03/25/2022 23:19   DG Hip Unilat W or Wo Pelvis 2-3 Views Right  Result Date: 03/25/2022 CLINICAL DATA:  Fall, pain EXAM: DG HIP (WITH OR WITHOUT PELVIS) 2-3V RIGHT COMPARISON:  None Available. FINDINGS: Mild degenerative changes in the hips, symmetric. SI joints symmetric and unremarkable. No acute bony abnormality. Specifically, no fracture, subluxation, or dislocation. IMPRESSION: No acute bony abnormality. Electronically Signed   By: Rolm Baptise M.D.   On: 03/25/2022 23:17    Labs: BNP (last 3 results) No results for input(s): "BNP" in the last 8760 hours. Basic Metabolic  Panel: Recent Labs  Lab 03/26/22 0336 03/26/22 1805 03/27/22 0634 03/28/22 0446 03/29/22 0512 03/30/22 0633  NA 135  --  134* 134* 132* 134*  K 3.9  --  3.1* 3.3* 3.7 3.5  CL 102  --  105 105 101 99  CO2 25  --  26 25 26 26   GLUCOSE 122*  --  115* 124* 100* 112*  BUN 14  --  12 13 7* 10  CREATININE 0.58 0.46 0.42* 0.39* 0.42* 0.35*  CALCIUM 9.0  --  8.1* 8.0* 8.0* 8.3*  MG 2.0  --   --  1.8 1.9  --    Liver Function Tests: Recent Labs  Lab 03/26/22 0336  AST 35  ALT 26  ALKPHOS 118  BILITOT 1.1  PROT 6.9  ALBUMIN 3.5   No results for input(s): "LIPASE", "AMYLASE" in the last 168 hours. No results for input(s): "AMMONIA" in the last 168 hours. CBC: Recent Labs  Lab 03/26/22 0101 03/26/22 0336 03/26/22 1805 03/27/22 0634 03/28/22 0446 03/29/22 0512 03/30/22 0633  WBC 7.9 6.4 5.3 6.3 5.6 5.4 4.3  NEUTROABS 6.2 4.7  --   --   --   --   --   HGB 12.3 13.0 11.1* 11.5* 11.0* 11.6* 11.0*  HCT 37.3 40.1 33.7* 33.8* 32.5* 34.4* 33.1*  MCV 87.8 89.1 87.8 87.1 87.4 87.5 87.6  PLT 258 255 178 175 204 245 253   Cardiac Enzymes: No results for input(s): "CKTOTAL", "CKMB", "CKMBINDEX", "TROPONINI" in the last 168  hours. BNP: Invalid input(s): "POCBNP" CBG: No results for input(s): "GLUCAP" in the last 168 hours. D-Dimer No results for input(s): "DDIMER" in the last 72 hours. Hgb A1c No results for input(s): "HGBA1C" in the last 72 hours. Lipid Profile No results for input(s): "CHOL", "HDL", "LDLCALC", "TRIG", "CHOLHDL", "LDLDIRECT" in the last 72 hours. Thyroid function studies No results for input(s): "TSH", "T4TOTAL", "T3FREE", "THYROIDAB" in the last 72 hours.  Invalid input(s): "FREET3" Anemia work up No results for input(s): "VITAMINB12", "FOLATE", "FERRITIN", "TIBC", "IRON", "RETICCTPCT" in the last 72 hours. Urinalysis    Component Value Date/Time   COLORURINE STRAW (A) 03/26/2022 0240   APPEARANCEUR CLEAR 03/26/2022 0240   LABSPEC 1.004 (L) 03/26/2022 0240   PHURINE 7.0 03/26/2022 0240   GLUCOSEU NEGATIVE 03/26/2022 0240   HGBUR NEGATIVE 03/26/2022 0240   BILIRUBINUR NEGATIVE 03/26/2022 0240   KETONESUR NEGATIVE 03/26/2022 0240   PROTEINUR NEGATIVE 03/26/2022 0240   NITRITE NEGATIVE 03/26/2022 0240   LEUKOCYTESUR TRACE (A) 03/26/2022 0240   Sepsis Labs Recent Labs  Lab 03/27/22 1791 03/28/22 0446 03/29/22 0512 03/30/22 0633  WBC 6.3 5.6 5.4 4.3   Microbiology No results found for this or any previous visit (from the past 240 hour(s)).   Time coordinating discharge: 35 minutes  SIGNED: Antonieta Pert, MD  [facility] 03/30/2022, 12:47 PM  If 7PM-7AM, please contact night-coverage [email or web address]

## 2022-03-30 NOTE — Plan of Care (Signed)
Pt alert and oriented x 4. Pt med compliant. Pt has received 2 doses of dilaudid this shift. Refused use of tramadol  Problem: Education: Goal: Knowledge of General Education information will improve Description: Including pain rating scale, medication(s)/side effects and non-pharmacologic comfort measures Outcome: Progressing   Problem: Health Behavior/Discharge Planning: Goal: Ability to manage health-related needs will improve Outcome: Progressing   Problem: Clinical Measurements: Goal: Ability to maintain clinical measurements within normal limits will improve Outcome: Progressing Goal: Will remain free from infection Outcome: Progressing Goal: Diagnostic test results will improve Outcome: Progressing Goal: Respiratory complications will improve Outcome: Progressing Goal: Cardiovascular complication will be avoided Outcome: Progressing   Problem: Activity: Goal: Risk for activity intolerance will decrease Outcome: Progressing   Problem: Nutrition: Goal: Adequate nutrition will be maintained Outcome: Progressing   Problem: Coping: Goal: Level of anxiety will decrease Outcome: Progressing   Problem: Elimination: Goal: Will not experience complications related to bowel motility Outcome: Progressing Goal: Will not experience complications related to urinary retention Outcome: Progressing   Problem: Pain Managment: Goal: General experience of comfort will improve Outcome: Progressing   Problem: Safety: Goal: Ability to remain free from injury will improve Outcome: Progressing   Problem: Skin Integrity: Goal: Risk for impaired skin integrity will decrease Outcome: Progressing   Problem: Education: Goal: Verbalization of understanding the information provided (i.e., activity precautions, restrictions, etc) will improve Outcome: Progressing Goal: Individualized Educational Video(s) Outcome: Progressing   Problem: Activity: Goal: Ability to ambulate and perform  ADLs will improve Outcome: Progressing   Problem: Clinical Measurements: Goal: Postoperative complications will be avoided or minimized Outcome: Progressing   Problem: Self-Concept: Goal: Ability to maintain and perform role responsibilities to the fullest extent possible will improve Outcome: Progressing   Problem: Pain Management: Goal: Pain level will decrease Outcome: Progressing

## 2022-03-31 LAB — BPAM RBC
Blood Product Expiration Date: 202311032359
Blood Product Expiration Date: 202311052359
Unit Type and Rh: 5100
Unit Type and Rh: 5100

## 2022-03-31 LAB — TYPE AND SCREEN
ABO/RH(D): O POS
Antibody Screen: NEGATIVE
Unit division: 0
Unit division: 0

## 2022-04-01 ENCOUNTER — Encounter (HOSPITAL_COMMUNITY): Payer: Self-pay | Admitting: Orthopedic Surgery

## 2022-04-01 NOTE — TOC CM/SW Note (Signed)
[facility] updated TOC this morning that patient left AMA yesterday, Apparently had some concerns about her 72 yr old grandson.

## 2022-04-03 ENCOUNTER — Telehealth: Payer: Self-pay | Admitting: Radiology

## 2022-04-03 DIAGNOSIS — S72001D Fracture of unspecified part of neck of right femur, subsequent encounter for closed fracture with routine healing: Secondary | ICD-10-CM

## 2022-04-03 NOTE — Telephone Encounter (Signed)
[facility] called and LMVM stating patient called them to inquire on getting a bedside commode and wheelchair.  They would defer to [place] to order/facilitate.  Please let patient know.  Sending to you for Monday.

## 2022-04-06 ENCOUNTER — Telehealth: Payer: Self-pay | Admitting: Radiology

## 2022-04-06 NOTE — Telephone Encounter (Signed)
Not sure why she was not given orders for [facility] and Bedside commode upon d/c from hospital, that's a problem   To Dr Lemmie Evens orders on your computer for signature

## 2022-04-06 NOTE — Telephone Encounter (Signed)
Called patient I have orders ready for her bedside commode and wheelchair, seems like she should have gotten these orders before she left hospital.   I dont know what she wants done with them if she wants them faxed to [facility] or if some one wants to pick them up  Unable to leave message, her mailbox is full.

## 2022-04-07 ENCOUNTER — Telehealth: Payer: Self-pay | Admitting: Orthopedic Surgery

## 2022-04-07 NOTE — Telephone Encounter (Signed)
Patient called relaying she may be unable to come to her post op appointment with staple/suture removal on Fri, 04/10/22, 'because of still not being able to walk.'  Patient said she is not really getting around her home, but said she can still try to arrange a ride for Friday.  I mentioned option of [facility], which would require a 3-day notice) Please call patient with any advice.

## 2022-04-09 NOTE — Telephone Encounter (Signed)
Called patient - as of this afternoon, 04/09/22, still working on trying to get a ride. Emphasized importance of post op follow up visits, and in this case, removal of sutures/stapes. Patient voiced understanding.

## 2022-04-10 ENCOUNTER — Encounter: Payer: Medicare Other | Admitting: Orthopedic Surgery

## 2022-04-16 ENCOUNTER — Ambulatory Visit (INDEPENDENT_AMBULATORY_CARE_PROVIDER_SITE_OTHER): Payer: Medicare Other | Admitting: Orthopedic Surgery

## 2022-04-16 ENCOUNTER — Ambulatory Visit (INDEPENDENT_AMBULATORY_CARE_PROVIDER_SITE_OTHER): Payer: Medicare Other

## 2022-04-16 DIAGNOSIS — S72001D Fracture of unspecified part of neck of right femur, subsequent encounter for closed fracture with routine healing: Secondary | ICD-10-CM | POA: Diagnosis not present

## 2022-04-16 NOTE — Progress Notes (Signed)
PROCEDURE:  Procedure(s): INTERNAL FIXATION RIGHT HIP (Right)   Right femoral neck fracture subcapital nondisplaced   Implants Bi[employer] titanium screws x3 all 75 mm with a washer  FOLLOW UP   Encounter Diagnosis  Name Primary?   Closed fracture of right hip with routine healing, subsequent encounter 03/26/22  Yes     Chief Complaint  Patient presents with   Post-op Follow-up    Right hip 03/26/22 improving staples removed without difficulty     Postop visit #1  Postop day 21  X-rays show maintenance of reduction and no hardware complications  Weightbearing status as tolerated  Progressive physical therapy also as tolerated

## 2022-05-19 ENCOUNTER — Other Ambulatory Visit: Payer: Self-pay | Admitting: Nurse Practitioner

## 2022-05-28 ENCOUNTER — Telehealth: Payer: Self-pay | Admitting: Orthopedic Surgery

## 2022-05-28 ENCOUNTER — Encounter: Payer: Medicare Other | Admitting: Orthopedic Surgery

## 2022-05-28 NOTE — Telephone Encounter (Signed)
Someone lvm for the pt to cx her appointment for today, pt had chemo yesterday and not feeling up to coming, tried to cb to rs, not able to leave a message and she doesn't have mychart.

## 2022-06-15 ENCOUNTER — Encounter: Payer: Medicare Other | Admitting: Orthopedic Surgery

## 2022-07-31 ENCOUNTER — Other Ambulatory Visit: Payer: Self-pay

## 2022-07-31 ENCOUNTER — Emergency Department (HOSPITAL_COMMUNITY): Payer: Medicare Other

## 2022-07-31 ENCOUNTER — Inpatient Hospital Stay (HOSPITAL_COMMUNITY)
Admission: EM | Admit: 2022-07-31 | Discharge: 2022-08-28 | DRG: 189 | Disposition: E | Payer: Medicare Other | Attending: Internal Medicine | Admitting: Internal Medicine

## 2022-07-31 ENCOUNTER — Encounter (HOSPITAL_COMMUNITY): Payer: Self-pay | Admitting: Emergency Medicine

## 2022-07-31 DIAGNOSIS — Z8544 Personal history of malignant neoplasm of other female genital organs: Secondary | ICD-10-CM

## 2022-07-31 DIAGNOSIS — Z91041 Radiographic dye allergy status: Secondary | ICD-10-CM

## 2022-07-31 DIAGNOSIS — Z85038 Personal history of other malignant neoplasm of large intestine: Secondary | ICD-10-CM

## 2022-07-31 DIAGNOSIS — Z888 Allergy status to other drugs, medicaments and biological substances status: Secondary | ICD-10-CM

## 2022-07-31 DIAGNOSIS — Z9221 Personal history of antineoplastic chemotherapy: Secondary | ICD-10-CM

## 2022-07-31 DIAGNOSIS — F39 Unspecified mood [affective] disorder: Secondary | ICD-10-CM | POA: Diagnosis present

## 2022-07-31 DIAGNOSIS — C7971 Secondary malignant neoplasm of right adrenal gland: Secondary | ICD-10-CM | POA: Diagnosis present

## 2022-07-31 DIAGNOSIS — Z79899 Other long term (current) drug therapy: Secondary | ICD-10-CM

## 2022-07-31 DIAGNOSIS — C7972 Secondary malignant neoplasm of left adrenal gland: Secondary | ICD-10-CM | POA: Diagnosis present

## 2022-07-31 DIAGNOSIS — Z923 Personal history of irradiation: Secondary | ICD-10-CM

## 2022-07-31 DIAGNOSIS — I4891 Unspecified atrial fibrillation: Secondary | ICD-10-CM | POA: Diagnosis not present

## 2022-07-31 DIAGNOSIS — Z933 Colostomy status: Secondary | ICD-10-CM

## 2022-07-31 DIAGNOSIS — R54 Age-related physical debility: Secondary | ICD-10-CM | POA: Diagnosis present

## 2022-07-31 DIAGNOSIS — Z515 Encounter for palliative care: Secondary | ICD-10-CM

## 2022-07-31 DIAGNOSIS — K219 Gastro-esophageal reflux disease without esophagitis: Secondary | ICD-10-CM | POA: Diagnosis present

## 2022-07-31 DIAGNOSIS — R1310 Dysphagia, unspecified: Secondary | ICD-10-CM | POA: Diagnosis present

## 2022-07-31 DIAGNOSIS — G893 Neoplasm related pain (acute) (chronic): Secondary | ICD-10-CM | POA: Diagnosis present

## 2022-07-31 DIAGNOSIS — R7989 Other specified abnormal findings of blood chemistry: Secondary | ICD-10-CM | POA: Diagnosis present

## 2022-07-31 DIAGNOSIS — Z886 Allergy status to analgesic agent status: Secondary | ICD-10-CM

## 2022-07-31 DIAGNOSIS — C3491 Malignant neoplasm of unspecified part of right bronchus or lung: Secondary | ICD-10-CM | POA: Diagnosis present

## 2022-07-31 DIAGNOSIS — C3492 Malignant neoplasm of unspecified part of left bronchus or lung: Secondary | ICD-10-CM | POA: Diagnosis present

## 2022-07-31 DIAGNOSIS — Z87891 Personal history of nicotine dependence: Secondary | ICD-10-CM

## 2022-07-31 DIAGNOSIS — Z881 Allergy status to other antibiotic agents status: Secondary | ICD-10-CM

## 2022-07-31 DIAGNOSIS — C7801 Secondary malignant neoplasm of right lung: Secondary | ICD-10-CM

## 2022-07-31 DIAGNOSIS — Z9049 Acquired absence of other specified parts of digestive tract: Secondary | ICD-10-CM

## 2022-07-31 DIAGNOSIS — Z66 Do not resuscitate: Secondary | ICD-10-CM | POA: Diagnosis not present

## 2022-07-31 DIAGNOSIS — I10 Essential (primary) hypertension: Secondary | ICD-10-CM | POA: Diagnosis present

## 2022-07-31 DIAGNOSIS — J9601 Acute respiratory failure with hypoxia: Secondary | ICD-10-CM | POA: Diagnosis not present

## 2022-07-31 DIAGNOSIS — C20 Malignant neoplasm of rectum: Secondary | ICD-10-CM | POA: Diagnosis present

## 2022-07-31 DIAGNOSIS — C7951 Secondary malignant neoplasm of bone: Secondary | ICD-10-CM | POA: Diagnosis present

## 2022-07-31 DIAGNOSIS — Z9981 Dependence on supplemental oxygen: Secondary | ICD-10-CM

## 2022-07-31 LAB — COMPREHENSIVE METABOLIC PANEL
ALT: 16 U/L (ref 0–44)
AST: 38 U/L (ref 15–41)
Albumin: 2.8 g/dL — ABNORMAL LOW (ref 3.5–5.0)
Alkaline Phosphatase: 129 U/L — ABNORMAL HIGH (ref 38–126)
Anion gap: 12 (ref 5–15)
BUN: 14 mg/dL (ref 8–23)
CO2: 24 mmol/L (ref 22–32)
Calcium: 8.3 mg/dL — ABNORMAL LOW (ref 8.9–10.3)
Chloride: 93 mmol/L — ABNORMAL LOW (ref 98–111)
Creatinine, Ser: 0.44 mg/dL (ref 0.44–1.00)
GFR, Estimated: >60 mL/min (ref >60)
Glucose, Bld: 105 mg/dL — ABNORMAL HIGH (ref 70–99)
Potassium: 3.8 mmol/L (ref 3.5–5.1)
Sodium: 129 mmol/L — ABNORMAL LOW (ref 135–145)
Total Bilirubin: 0.9 mg/dL (ref 0.3–1.2)
Total Protein: 7.2 g/dL (ref 6.5–8.1)

## 2022-07-31 LAB — CBC WITH DIFFERENTIAL/PLATELET
Abs Immature Granulocytes: 0.1 10*3/uL — ABNORMAL HIGH (ref 0.00–0.07)
Basophils Absolute: 0 10*3/uL (ref 0.0–0.1)
Basophils Relative: 0 %
Eosinophils Absolute: 0 10*3/uL (ref 0.0–0.5)
Eosinophils Relative: 0 %
HCT: 39.3 % (ref 36.0–46.0)
Hemoglobin: 12.7 g/dL (ref 12.0–15.0)
Immature Granulocytes: 1 %
Lymphocytes Relative: 10 %
Lymphs Abs: 0.9 10*3/uL (ref 0.7–4.0)
MCH: 26.8 pg (ref 26.0–34.0)
MCHC: 32.3 g/dL (ref 30.0–36.0)
MCV: 83.1 fL (ref 80.0–100.0)
Monocytes Absolute: 0.9 10*3/uL (ref 0.1–1.0)
Monocytes Relative: 9 %
Neutro Abs: 7.2 10*3/uL (ref 1.7–7.7)
Neutrophils Relative %: 80 %
Platelets: 489 10*3/uL — ABNORMAL HIGH (ref 150–400)
RBC: 4.73 MIL/uL (ref 3.87–5.11)
RDW: 14.6 % (ref 11.5–15.5)
WBC: 9.1 10*3/uL (ref 4.0–10.5)
nRBC: 0 % (ref 0.0–0.2)

## 2022-07-31 MED ORDER — SODIUM CHLORIDE 0.9 % IV SOLN: INTRAVENOUS | Status: DC

## 2022-07-31 MED ORDER — SODIUM CHLORIDE 0.9 % IV BOLUS
500.0000 mL | Freq: Once | INTRAVENOUS | Status: AC
  Administered 2022-08-01: 500 mL via INTRAVENOUS

## 2022-07-31 MED ORDER — ALBUTEROL SULFATE HFA 108 (90 BASE) MCG/ACT IN AERS: 2.0000 | INHALATION_SPRAY | RESPIRATORY_TRACT | Status: DC | PRN

## 2022-07-31 NOTE — ED Triage Notes (Addendum)
Pt via EMS from home c/o severe difficulty swallowing w/o foreign body sensation x 2 weeks. Pt feels like her "throat is closing" and has difficulty clearing her throat. No stroke symptoms noted. Pt is on hospice for lung cancer with no recent chemo or radiation treatments. Also reports mild dizziness since earlier today. Received' saline neb en route w/o relief. Pt is only able to speak one word at a time due to SOB r/t cancer. Vitals unremarkable per EMS report, wears 2L oxygen at baseline and arrives on same.

## 2022-07-31 NOTE — ED Provider Notes (Signed)
[facility] Provider Note   CSN: [id number] Arrival date & time: 08/23/2022  1837     History {Add pertinent medical, surgical, social history, OB history to HPI:1} Chief Complaint  Patient presents with   Dysphagia    Caitlin Mckinney is a 72 y.o. female.  HPI     Home Medications Prior to Admission medications   Medication Sig Start Date End Date Taking? Authorizing Provider  amLODipine (NORVASC) 5 MG tablet Take 5 mg by mouth daily. 09/27/19  Yes [provider]  atenolol (TENORMIN) 50 MG tablet Take 50 mg by mouth daily. 01/20/22  Yes [provider]  docusate sodium (COLACE) 100 MG capsule Take 1 capsule (100 mg total) by mouth every 12 (twelve) hours. 02/19/22  Yes Sherwood Gambler, MD  fluticasone (FLONASE) 50 MCG/ACT nasal spray Place 1 spray into both nostrils daily as needed for allergies or rhinitis.   Yes [provider]  HYDROmorphone (DILAUDID) 2 MG tablet Take 1 tablet (2 mg total) by mouth every 3 (three) hours as needed for up to 3 doses for moderate pain. 03/30/22  Yes Antonieta Pert, MD  loratadine (CLARITIN) 10 MG tablet Take 10 mg by mouth daily.   Yes [provider]  methylphenidate (RITALIN) 10 MG tablet Take 20 mg by mouth every morning.   Yes [provider]  omeprazole (PRILOSEC) 20 MG capsule Take 1 capsule by mouth daily.   Yes [provider]  ondansetron (ZOFRAN) 8 MG tablet Take 1 tablet by mouth twice daily on days 2 TO 3 of each chemotherapy cycle. May take up to 1 tablet every 8 hours as needed, but do not take on day 1 of cycles Patient taking differently: Take 8 mg by mouth every 8 (eight) hours as needed for vomiting or nausea. 06/25/22  Yes Arnell Asal, NP  polyethylene glycol (MIRALAX / GLYCOLAX) 17 g packet Take 17 g by mouth 2 (two) times daily as needed. 02/19/22  Yes Sherwood Gambler, MD  enoxaparin (LOVENOX) 40 MG/0.4ML injection Inject 0.4 mLs (40  mg total) into the skin daily for 28 days. Patient not taking: Reported on 04/16/2022 03/31/22 04/28/22  Antonieta Pert, MD  escitalopram (LEXAPRO) 20 MG tablet Take 20 mg by mouth daily. Patient not taking: Reported on 08/19/2022    [provider]  fentaNYL (DURAGESIC) 25 MCG/HR 1 patch every 3 (three) days. Patient not taking: Reported on 08/07/2022 04/15/22   [provider]  HYDROcodone-acetaminophen (NORCO/VICODIN) 5-325 MG tablet Take 1 tablet by mouth every 6 (six) hours as needed. Patient not taking: Reported on 08/17/2022 06/03/22   [provider]  potassium chloride (KLOR-CON) 10 MEQ tablet Take 10 mEq by mouth daily. Patient not taking: Reported on 08/07/2022 06/06/22   [provider]  prochlorperazine (COMPAZINE) 10 MG tablet Take 1 tablet (10 mg total) by mouth every six (6) hours as needed (nausea). Patient not taking: Reported on 08/19/2022 04/29/22   [provider]  promethazine (PHENERGAN) 25 MG tablet Take 25 mg by mouth every 6 (six) hours as needed. Patient not taking: Reported on 08/21/2022 04/02/22   [provider]      Allergies    Contrast media [iodinated contrast media], Other, Allevess [capsaicin-menthol], Erythromycin, and Naproxen    Review of Systems   Review of Systems  Physical Exam Updated Vital Signs BP (!) 111/100   Pulse (!) 101   Temp 98.4 F (36.9 C) (Oral)   Resp 18  Ht 1.524 m (5')   Wt 45.4 kg   SpO2 98%   BMI 19.53 kg/m  Physical Exam  ED Results / Procedures / Treatments   Labs (all labs ordered are listed, but only abnormal results are displayed) Labs Reviewed - No data to display  EKG EKG Interpretation  Date/Time:  Friday July 31 2022 19:59:27 EST Ventricular Rate:  103 PR Interval:  156 QRS Duration: 122 QT Interval:  396 QTC Calculation: 519 R Axis:   32 Text Interpretation: Sinus tachycardia Left bundle branch block Confirmed by Fredia Sorrow [phone]) on 08/10/2022 10:30:13  PM  Radiology DG Chest 2 View  Result Date: 08/20/2022 CLINICAL DATA:  Short of breath, history of metastatic rectal cancer, concern for foreign body EXAM: CHEST - 2 VIEW COMPARISON:  06/18/2022 FINDINGS: Frontal and lateral views of the chest demonstrate a stable left chest wall port via internal jugular approach tip within the region of the superior vena cava. Cardiac silhouette is unchanged. Interval enlargement of the bilateral pulmonary metastases seen on prior exam consistent with progression of disease. No acute airspace disease, effusion, or pneumothorax. Chronic elevation left hemidiaphragm. Chronic compression deformity at the thoracolumbar junction. Stable postsurgical changes left shoulder. IMPRESSION: 1. Enlarging bilateral pulmonary metastases consistent with progression of disease. 2. No evidence of unexpected radiopaque foreign body. Electronically Signed   By: Randa Ngo M.D.   On: 08/11/2022 19:30    Procedures Procedures  {Document cardiac monitor, telemetry assessment procedure when appropriate:1}  Medications Ordered in ED Medications  albuterol (VENTOLIN HFA) 108 (90 Base) MCG/ACT inhaler 2 puff (has no administration in time range)    ED Course/ Medical Decision Making/ A&P   {   Click here for ABCD2, HEART and other calculatorsREFRESH Note before signing :1}                          Medical Decision Making Amount and/or Complexity of Data Reviewed Radiology: ordered.  Risk Prescription drug management.   ***  {Document critical care time when appropriate:1} {Document review of labs and clinical decision tools ie heart score, Chads2Vasc2 etc:1}  {Document your independent review of radiology images, and any outside records:1} {Document your discussion with family members, caretakers, and with consultants:1} {Document social determinants of health affecting pt's care:1} {Document your decision making why or why not admission, treatments were  needed:1} Final Clinical Impression(s) / ED Diagnoses Final diagnoses:  None    Rx / DC Orders ED Discharge Orders     None

## 2022-07-31 NOTE — ED Notes (Signed)
Checked on patient about 2020 , patient had good air movement , but was having difficulty speaking , states she has had trouble since radiation treatment for lung cancer back in aug ??? .

## 2022-07-31 NOTE — ED Notes (Signed)
Patient transported to CT 

## 2022-08-01 ENCOUNTER — Encounter (HOSPITAL_COMMUNITY): Payer: Self-pay

## 2022-08-01 ENCOUNTER — Emergency Department (HOSPITAL_COMMUNITY): Payer: Medicare Other

## 2022-08-01 ENCOUNTER — Inpatient Hospital Stay (HOSPITAL_COMMUNITY): Payer: Medicare Other

## 2022-08-01 ENCOUNTER — Other Ambulatory Visit: Payer: Self-pay

## 2022-08-01 DIAGNOSIS — C7800 Secondary malignant neoplasm of unspecified lung: Secondary | ICD-10-CM | POA: Diagnosis not present

## 2022-08-01 DIAGNOSIS — Z881 Allergy status to other antibiotic agents status: Secondary | ICD-10-CM | POA: Diagnosis not present

## 2022-08-01 DIAGNOSIS — R7989 Other specified abnormal findings of blood chemistry: Secondary | ICD-10-CM | POA: Diagnosis present

## 2022-08-01 DIAGNOSIS — J9601 Acute respiratory failure with hypoxia: Secondary | ICD-10-CM | POA: Diagnosis present

## 2022-08-01 DIAGNOSIS — C7971 Secondary malignant neoplasm of right adrenal gland: Secondary | ICD-10-CM | POA: Diagnosis present

## 2022-08-01 DIAGNOSIS — Z888 Allergy status to other drugs, medicaments and biological substances status: Secondary | ICD-10-CM | POA: Diagnosis not present

## 2022-08-01 DIAGNOSIS — Z85038 Personal history of other malignant neoplasm of large intestine: Secondary | ICD-10-CM | POA: Diagnosis not present

## 2022-08-01 DIAGNOSIS — Z515 Encounter for palliative care: Secondary | ICD-10-CM | POA: Diagnosis not present

## 2022-08-01 DIAGNOSIS — C7972 Secondary malignant neoplasm of left adrenal gland: Secondary | ICD-10-CM | POA: Diagnosis present

## 2022-08-01 DIAGNOSIS — I10 Essential (primary) hypertension: Secondary | ICD-10-CM | POA: Diagnosis present

## 2022-08-01 DIAGNOSIS — I4891 Unspecified atrial fibrillation: Secondary | ICD-10-CM

## 2022-08-01 DIAGNOSIS — C7951 Secondary malignant neoplasm of bone: Secondary | ICD-10-CM | POA: Diagnosis present

## 2022-08-01 DIAGNOSIS — F39 Unspecified mood [affective] disorder: Secondary | ICD-10-CM | POA: Diagnosis present

## 2022-08-01 DIAGNOSIS — C3492 Malignant neoplasm of unspecified part of left bronchus or lung: Secondary | ICD-10-CM | POA: Diagnosis present

## 2022-08-01 DIAGNOSIS — C3491 Malignant neoplasm of unspecified part of right bronchus or lung: Secondary | ICD-10-CM | POA: Diagnosis present

## 2022-08-01 DIAGNOSIS — Z933 Colostomy status: Secondary | ICD-10-CM | POA: Diagnosis not present

## 2022-08-01 DIAGNOSIS — Z886 Allergy status to analgesic agent status: Secondary | ICD-10-CM | POA: Diagnosis not present

## 2022-08-01 DIAGNOSIS — Z91041 Radiographic dye allergy status: Secondary | ICD-10-CM | POA: Diagnosis not present

## 2022-08-01 DIAGNOSIS — C20 Malignant neoplasm of rectum: Secondary | ICD-10-CM | POA: Diagnosis present

## 2022-08-01 DIAGNOSIS — Z79899 Other long term (current) drug therapy: Secondary | ICD-10-CM | POA: Diagnosis not present

## 2022-08-01 DIAGNOSIS — K219 Gastro-esophageal reflux disease without esophagitis: Secondary | ICD-10-CM | POA: Diagnosis present

## 2022-08-01 DIAGNOSIS — Z7189 Other specified counseling: Secondary | ICD-10-CM | POA: Diagnosis not present

## 2022-08-01 DIAGNOSIS — C7801 Secondary malignant neoplasm of right lung: Secondary | ICD-10-CM

## 2022-08-01 DIAGNOSIS — Z66 Do not resuscitate: Secondary | ICD-10-CM | POA: Diagnosis not present

## 2022-08-01 DIAGNOSIS — R1310 Dysphagia, unspecified: Secondary | ICD-10-CM | POA: Diagnosis present

## 2022-08-01 DIAGNOSIS — G893 Neoplasm related pain (acute) (chronic): Secondary | ICD-10-CM | POA: Diagnosis present

## 2022-08-01 DIAGNOSIS — Z9049 Acquired absence of other specified parts of digestive tract: Secondary | ICD-10-CM | POA: Diagnosis not present

## 2022-08-01 DIAGNOSIS — C7802 Secondary malignant neoplasm of left lung: Secondary | ICD-10-CM

## 2022-08-01 LAB — BLOOD GAS, ARTERIAL
Acid-base deficit: 2 mmol/L (ref 0.0–2.0)
Bicarbonate: 22.3 mmol/L (ref 20.0–28.0)
FIO2: 50 %
O2 Saturation: 99.8 %
Patient temperature: 37
pCO2 arterial: 36 mmHg (ref 32–48)
pH, Arterial: 7.4 (ref 7.35–7.45)
pO2, Arterial: 138 mmHg — ABNORMAL HIGH (ref 83–108)

## 2022-08-01 LAB — PHOSPHORUS: Phosphorus: 3.6 mg/dL (ref 2.5–4.6)

## 2022-08-01 LAB — TROPONIN I (HIGH SENSITIVITY)
Troponin I (High Sensitivity): 570 ng/L (ref <18)
Troponin I (High Sensitivity): 552 ng/L (ref <18)
Troponin I (High Sensitivity): 344 ng/L (ref <18)

## 2022-08-01 LAB — BRAIN NATRIURETIC PEPTIDE: B Natriuretic Peptide: 460 pg/mL — ABNORMAL HIGH (ref 0.0–100.0)

## 2022-08-01 LAB — HEPARIN LEVEL (UNFRACTIONATED): Heparin Unfractionated: <0.1 IU/mL — ABNORMAL LOW (ref 0.30–0.70)

## 2022-08-01 LAB — TSH: TSH: 4.423 u[IU]/mL (ref 0.350–4.500)

## 2022-08-01 LAB — PROTIME-INR
INR: 1.3 — ABNORMAL HIGH (ref 0.8–1.2)
Prothrombin Time: 16.5 seconds — ABNORMAL HIGH (ref 11.4–15.2)

## 2022-08-01 LAB — MAGNESIUM: Magnesium: 1.7 mg/dL (ref 1.7–2.4)

## 2022-08-01 MED ORDER — HEPARIN BOLUS VIA INFUSION
2500.0000 [IU] | Freq: Once | INTRAVENOUS | Status: AC
  Administered 2022-08-01: 2500 [IU] via INTRAVENOUS

## 2022-08-01 MED ORDER — CHLORHEXIDINE GLUCONATE CLOTH 2 % EX PADS: 6.0000 | MEDICATED_PAD | Freq: Every day | CUTANEOUS | Status: DC

## 2022-08-01 MED ORDER — SODIUM CHLORIDE 0.9 % IV SOLN: INTRAVENOUS | Status: DC

## 2022-08-01 MED ORDER — MAGNESIUM SULFATE 2 GM/50ML IV SOLN
2.0000 g | Freq: Once | INTRAVENOUS | Status: AC
  Administered 2022-08-01: 2 g via INTRAVENOUS
  Filled 2022-08-01: qty 50

## 2022-08-01 MED ORDER — OXYCODONE HCL 5 MG PO TABS: 5.0000 mg | ORAL_TABLET | ORAL | Status: DC | PRN

## 2022-08-01 MED ORDER — LORAZEPAM 2 MG/ML IJ SOLN
0.5000 mg | Freq: Four times a day (QID) | INTRAMUSCULAR | Status: DC | PRN
  Administered 2022-08-01 – 2022-08-02 (×2): 0.5 mg via INTRAVENOUS
  Filled 2022-08-01 (×2): qty 1

## 2022-08-01 MED ORDER — SENNOSIDES-DOCUSATE SODIUM 8.6-50 MG PO TABS: 1.0000 | ORAL_TABLET | Freq: Every evening | ORAL | Status: DC | PRN

## 2022-08-01 MED ORDER — LEVALBUTEROL HCL 0.63 MG/3ML IN NEBU
INHALATION_SOLUTION | RESPIRATORY_TRACT | Status: AC
  Administered 2022-08-01: 0.63 mg
  Filled 2022-08-01: qty 3

## 2022-08-01 MED ORDER — SODIUM CHLORIDE 0.9% FLUSH: 3.0000 mL | Freq: Two times a day (BID) | INTRAVENOUS | Status: DC

## 2022-08-01 MED ORDER — HEPARIN BOLUS VIA INFUSION
2000.0000 [IU] | Freq: Once | INTRAVENOUS | Status: AC
  Administered 2022-08-01: 2000 [IU] via INTRAVENOUS
  Filled 2022-08-01: qty 2000

## 2022-08-01 MED ORDER — ACETAMINOPHEN 650 MG RE SUPP: 650.0000 mg | Freq: Four times a day (QID) | RECTAL | Status: DC | PRN

## 2022-08-01 MED ORDER — HYDRALAZINE HCL 20 MG/ML IJ SOLN: 10.0000 mg | INTRAMUSCULAR | Status: DC | PRN

## 2022-08-01 MED ORDER — ETOMIDATE 2 MG/ML IV SOLN
INTRAVENOUS | Status: AC | PRN
  Administered 2022-08-01: 13.62 mg via INTRAVENOUS

## 2022-08-01 MED ORDER — SODIUM CHLORIDE 0.9% FLUSH: 3.0000 mL | INTRAVENOUS | Status: DC | PRN

## 2022-08-01 MED ORDER — DIAZEPAM 5 MG/ML IJ SOLN
2.5000 mg | Freq: Once | INTRAMUSCULAR | Status: AC
  Administered 2022-08-01: 2.5 mg via INTRAVENOUS
  Filled 2022-08-01 (×2): qty 2

## 2022-08-01 MED ORDER — LEVALBUTEROL HCL 0.63 MG/3ML IN NEBU: 0.6300 mg | INHALATION_SOLUTION | Freq: Four times a day (QID) | RESPIRATORY_TRACT | Status: DC | PRN

## 2022-08-01 MED ORDER — IPRATROPIUM BROMIDE 0.02 % IN SOLN
RESPIRATORY_TRACT | Status: AC
  Administered 2022-08-01: 0.5 mg
  Filled 2022-08-01: qty 2.5

## 2022-08-01 MED ORDER — SODIUM CHLORIDE 0.9 % IV BOLUS
500.0000 mL | Freq: Once | INTRAVENOUS | Status: AC
  Administered 2022-08-01: 500 mL via INTRAVENOUS

## 2022-08-01 MED ORDER — ETOMIDATE 2 MG/ML IV SOLN
0.3000 mg/kg | Freq: Once | INTRAVENOUS | Status: AC
  Administered 2022-08-01: 13.62 mg via INTRAVENOUS
  Filled 2022-08-01: qty 10

## 2022-08-01 MED ORDER — BISACODYL 5 MG PO TBEC: 5.0000 mg | DELAYED_RELEASE_TABLET | Freq: Every day | ORAL | Status: DC | PRN

## 2022-08-01 MED ORDER — SODIUM CHLORIDE 0.9 % IV SOLN: 250.0000 mL | INTRAVENOUS | Status: DC | PRN

## 2022-08-01 MED ORDER — ONDANSETRON HCL 4 MG PO TABS: 4.0000 mg | ORAL_TABLET | Freq: Four times a day (QID) | ORAL | Status: DC | PRN

## 2022-08-01 MED ORDER — ONDANSETRON HCL 4 MG/2ML IJ SOLN: 4.0000 mg | Freq: Four times a day (QID) | INTRAMUSCULAR | Status: DC | PRN

## 2022-08-01 MED ORDER — IPRATROPIUM BROMIDE 0.02 % IN SOLN: 0.5000 mg | Freq: Four times a day (QID) | RESPIRATORY_TRACT | Status: DC | PRN

## 2022-08-01 MED ORDER — HEPARIN SODIUM (PORCINE) 5000 UNIT/ML IJ SOLN: 5000.0000 [IU] | Freq: Three times a day (TID) | INTRAMUSCULAR | Status: DC

## 2022-08-01 MED ORDER — LEVALBUTEROL HCL 0.63 MG/3ML IN NEBU: 0.6300 mg | INHALATION_SOLUTION | RESPIRATORY_TRACT | Status: DC | PRN

## 2022-08-01 MED ORDER — LEVALBUTEROL HCL 0.63 MG/3ML IN NEBU
INHALATION_SOLUTION | RESPIRATORY_TRACT | Status: AC
  Administered 2022-08-01: 1.26 mg
  Filled 2022-08-01: qty 6

## 2022-08-01 MED ORDER — SODIUM CHLORIDE 0.9% FLUSH
3.0000 mL | Freq: Two times a day (BID) | INTRAVENOUS | Status: DC
  Administered 2022-08-01: 10 mL via INTRAVENOUS

## 2022-08-01 MED ORDER — TRAZODONE HCL 50 MG PO TABS: 25.0000 mg | ORAL_TABLET | Freq: Every evening | ORAL | Status: DC | PRN

## 2022-08-01 MED ORDER — ACETAMINOPHEN 325 MG PO TABS: 650.0000 mg | ORAL_TABLET | Freq: Four times a day (QID) | ORAL | Status: DC | PRN

## 2022-08-01 MED ORDER — HEPARIN (PORCINE) 25000 UT/250ML-% IV SOLN
950.0000 [IU]/h | INTRAVENOUS | Status: DC
  Administered 2022-08-01: 650 [IU]/h via INTRAVENOUS
  Administered 2022-08-02: 950 [IU]/h via INTRAVENOUS
  Filled 2022-08-01 (×2): qty 250

## 2022-08-01 MED ORDER — HYDROMORPHONE HCL 1 MG/ML IJ SOLN
0.5000 mg | INTRAMUSCULAR | Status: DC | PRN
  Administered 2022-08-01 – 2022-08-02 (×4): 1 mg via INTRAVENOUS
  Filled 2022-08-01 (×4): qty 1

## 2022-08-01 MED ORDER — FLEET ENEMA 7-19 GM/118ML RE ENEM: 1.0000 | ENEMA | Freq: Once | RECTAL | Status: DC | PRN

## 2022-08-01 NOTE — ED Notes (Signed)
Patient cardioverted on BiPAP. No problems encountered. Patient did well. Back to normal rhythm.

## 2022-08-01 NOTE — ED Notes (Signed)
Pt experienced sudden significant worsening SOB and dyspnea, O2 sat dropping to 82% on her baseline 2L. Increased O2 to 4L and informed MD of pt status. Called respiratory to evaluate pt.

## 2022-08-01 NOTE — ED Provider Notes (Signed)
Signout from Dr. Waverly Ferrari.  72 year old female history of question colon cancer mets to lung.  Seen last evening for difficulty breathing pain in her throat difficulty swallowing.  Patient was planned for discharge when she became more short of breath and found to be in A-fib with RVR.  She was placed on BiPAP and cardioverted.  Currently on BiPAP and sinus rhythm soft blood pressures.  She is awake and conversant on BiPAP denies any chest pain or shortness of breath.  Still remains tachypneic lungs are clear.  Troponins elevated at 570 and BNP elevated although chest x-ray does not show any signs of heart failure.  Plan is to discussed with hospitalist regarding admission.  Patient is on hospice but son wanted patient full scope of care.  It sounds like goals of care need to be clarified, no family present at this time. Physical Exam  BP 97/64   Pulse 97   Temp 97.6 F (36.4 C) (Axillary)   Resp (!) 21   Ht 5' (1.524 m)   Wt 45.4 kg   SpO2 98%   BMI 19.53 kg/m   Physical Exam  Procedures  Procedures  ED Course / MDM    Medical Decision Making Amount and/or Complexity of Data Reviewed Labs: ordered. Radiology: ordered.  Risk Prescription drug management. Decision regarding hospitalization.   Discussed with [facility] hospitalist Dr. Nila Nephew.  He asked if I could reach out to cardiology to get their recommendations.  He will try to reach the son to further discuss goals of care.  Discussed with Dr. Harl Bowie cardiology who said cardiology would consult on the patient when she arrives on campus.       Hayden Rasmussen, MD 08/01/22 1740

## 2022-08-01 NOTE — Assessment & Plan Note (Signed)
-   Monitoring BP closely, with holding medication at this point -As needed hydralazine

## 2022-08-01 NOTE — Progress Notes (Signed)
ANTICOAGULATION CONSULT NOTE - Initial Consult  Pharmacy Consult for heparin Indication: chest pain/ACS  Allergies  Allergen Reactions   Contrast Media [Iodinated Contrast Media] Shortness Of Breath   Other     States her blood pressure medicine made her cough    Allevess [Capsaicin-Menthol] Rash   Erythromycin Rash   Naproxen Rash    Patient Measurements: Height: 5' (152.4 cm) Weight: 45.4 kg (100 lb) IBW/kg (Calculated) : 45.5 Heparin Dosing Weight: 45kg  Vital Signs: Temp: 97.6 F (36.4 C) (02/03 0350) Temp Source: Axillary (02/03 0350) BP: 90/59 (02/03 0651) Pulse Rate: 98 (02/03 0651)  Labs: Recent Labs    08/17/2022 2000 08/01/22 0324  HGB 12.7  --   HCT 39.3  --   PLT 489*  --   CREATININE 0.44  --   TROPONINIHS  --  570*    Estimated Creatinine Clearance: 45.6 mL/min (by C-G formula based on SCr of 0.44 mg/dL).   Medical History: Past Medical History:  Diagnosis Date   Cancer [facility])    endometrial and colon 2010   Hypertension     Medications:  (Not in a hospital admission)  Scheduled:   heparin  2,500 Units Intravenous Once    Assessment: Pt developed short of breath and increase HR pending discharge. Heparin ordered to r/o MI   Goal of Therapy:  Heparin level 0.3-0.7 units/ml Monitor platelets by anticoagulation protocol: Yes   Plan:  Heparin bolus 2500 units x1 Heparin infusion 650 units/hr Check 8 hr HL  Onnie Boer, PharmD, [place], AAHIVP, CPP Infectious Disease Pharmacist 08/01/2022 7:00 AM

## 2022-08-01 NOTE — ED Notes (Addendum)
Patient removed from BiPAP , Given xopenex / atrovent neb. Patient is wheezing and having some prolonged exhalation. Paramedic  gave sedation to patient, shortly after placed patient back on BiPAP 12/5 f 20 45 % oxygen gave another inline neb with xopenex. Patient given xopenex because of atrial fib. Patient has received xopenex 0.63 x 3 , atrovent 0.5mg  x 1. Have been able to decrease oxygen since patient is sedated.

## 2022-08-01 NOTE — ED Notes (Signed)
Patient taken off bipap and placed on 4L [place] requested by patient and daughter.

## 2022-08-01 NOTE — Therapy (Signed)
Therapist checked on pt.  Pt not  medically ready at this time for PT.  Will monitor for improvement. Rayetta Humphrey, [place] CLT [phone]

## 2022-08-01 NOTE — Discharge Instructions (Addendum)
Follow back up with the [facility] cancer center.  Also follow back up with your hospice nurse to see if they are available to come out more frequently.  Today's workup showed CT neck without any cancer or metastatic disease in the neck area.  But massive disease in the lungs bilaterally including large lymph nodes.  This is probably an irreversible process.  Would have to discuss with the cancer center but based on the notes I think that there is not anything that they can do significantly to help at this point in time.

## 2022-08-01 NOTE — Progress Notes (Signed)
                                                     Palliative Care Progress Note   Patient Name: Caitlin Mckinney       Date: 08/01/2022 DOB: Jun 18, 1951  Age: 72 y.o. MRN#: [id number] Attending Physician: Deatra James, MD Primary Care Physician: Abran Richard, MD Admit Date: 08/01/2022  Phone call received for urgent consult re: [name] from attending Dr. Roger Shelter.    Chart reviewed.  I discussed patient and plan of care with dayshift RN Curt Bears and Dr. Roger Shelter.    As chart review, patient was previously a DNR and under hospice care (agency unknown at this time).    Patient is currently being evaluated in [facility] ED for dysphagia, acute A-fib, and acute respiratory failure.  Patient is on BiPAP and apparently unable to communicate her wishes.  However, RN documentation now states patient is on 4L [place] and has had discussions with RN in regards to code status with daughter at bedside.   In the interim of patient being on bipap, patient's son has reversed patient's code status and wants full code with complete cardiac workup, requiring transfer to [facility].  Unfortunately, there is no in person PMT provider at [facility] on the weekend to clarify goals of care with patient/family.    From my discussions with the medical team and chart review, I would strongly recommend further goals of care discussions in person prior to transferring patient to [facility].    If patient has been conversant and alert/oriented with RN today off of bipap (RN spoke with patient in regards to intubation/CPR with daughter present) then she may have capacity for decision making - not deferring decision making to her son/children.   If her previous wishes were DNR, hospice care, aging in place, symptom management, or any combination of the previously aforementioned then  the appropriate action should be to speak directly with the patient prior to transfer to clarify goals of care.    PMT will remain available to patient and family throughout her hospitalization.  Thank you for allowing the Palliative Medicine Team to assist in the care of [name].  [place]ha, FNP-BC Palliative Medicine Team Team Phone # [phone]  NO CHARGE

## 2022-08-01 NOTE — ED Notes (Signed)
Troponin level 570

## 2022-08-01 NOTE — Assessment & Plan Note (Signed)
-  As needed p.o. analgesics  -Along with a as needed IV analgesics-Dilaudid

## 2022-08-01 NOTE — Progress Notes (Signed)
Pt is too SOB to be able to use IS and Flutter at this time. Deep breathing techniques were reviewed with the patient.

## 2022-08-01 NOTE — Consult Note (Addendum)
Cardiology Consultation   Patient ID: MESSINA KOSINSKI MRN: [id number]; DOB: [birth year]  Admit date: 08/24/2022 Date of Consult: 08/01/2022  PCP:  Abran Richard, [place] Providers Cardiologist: New  Patient Profile:   Caitlin Mckinney is a 72 y.o. 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 Broadus John.  History of Present Illness:   Caitlin Mckinney is a 72 year old female with past medical history noted above.  She is currently following at [facility] for an extensive history of rectal adenocarcinoma with metastasis to bone and lung.  She has been receiving palliative chemotherapy.  Her last office visit 06/23/2022 noted she had been unable to tolerate palliative chemotherapy with 5-FU leucovorin and bevacizumab and had decided to stop treatment altogether.  She did not want to pursue any further chemotherapy and was against adding additional agents due to concern for side effects.  Discussion was noted in the office regarding survival of likely less than 6 months.  It was recommended that she consider hospice treatment which she was initially reluctant to do but later agreed.  She presented to [facility] ED on 2/3 with evaluation for dysphagia which progressed with worsening shortness of breath and eventual concern for atrial fibrillation with RVR.  She underwent cardioversion with EDP and ultimately required BiPAP for stabilization.  She was started on IV heparin drip.  Admission labs showed sodium 129, potassium 3.8, creatinine 0.4, albumin 2.8, BNP 460, high-sensitivity troponin 570>> 552>> 344, WBC 9.1, hemoglobin 12.7.  CT chest with diffuse metastatic disease including numerous bilateral pulmonary mets, prominent mediastinal lymphadenopathy, bilateral adrenal gland metastasis and large right clavicular lesion.  Notes indicate her  condition was discussed by ED provider and hospitalist with the patient's son who reversed her CODE STATUS from DNR to full code and wanted to pursue full scope of care.  She was transferred to [facility] for further evaluation.  At the bedside, she is working to breath. She is very dry and frail. She was comfortable with discussions with her daughter.   Past Medical History:  Diagnosis Date   Cancer [facility])    endometrial and colon 2010   Hypertension     Past Surgical History:  Procedure Laterality Date   ABDOMINAL HYSTERECTOMY     APPENDECTOMY     COLON RESECTION     COLON SURGERY     COLOSTOMY     COLOSTOMY REVERSAL     HIP PINNING,CANNULATED Right 03/26/2022   Procedure: INTERNAL FIXATION RIGHT HIP;  Surgeon: Carole Civil, MD;  Location: [facility]S;  Service: Orthopedics;  Laterality: Right;   SHOULDER ARTHROSCOPY     TUBAL LIGATION       Home Medications:  Prior to Admission medications   Medication Sig Start Date End Date Taking? Authorizing Provider  amLODipine (NORVASC) 5 MG tablet Take 5 mg by mouth daily. 09/27/19  Yes [provider]  atenolol (TENORMIN) 50 MG tablet Take 50 mg by mouth daily. 01/20/22  Yes [provider]  docusate sodium (COLACE) 100 MG capsule Take 1 capsule (100 mg total) by mouth every 12 (twelve) hours. 02/19/22  Yes Sherwood Gambler, MD  fluticasone (FLONASE) 50 MCG/ACT nasal spray Place 1 spray into both nostrils daily as needed for allergies or rhinitis.   Yes [provider]  HYDROmorphone (DILAUDID) 2 MG tablet Take 1 tablet (2 mg  total) by mouth every 3 (three) hours as needed for up to 3 doses for moderate pain. 03/30/22  Yes Antonieta Pert, MD  loratadine (CLARITIN) 10 MG tablet Take 10 mg by mouth daily.   Yes [provider]  methylphenidate (RITALIN) 10 MG tablet Take 20 mg by mouth every morning.   Yes [provider]  omeprazole (PRILOSEC) 20 MG capsule Take 1 capsule by mouth daily.   Yes [provider]  ondansetron (ZOFRAN) 8 MG tablet Take 1 tablet by mouth twice daily on days 2 TO 3 of each chemotherapy cycle. May take up to 1 tablet every 8 hours as needed, but do not take on day 1 of cycles Patient taking differently: Take 8 mg by mouth every 8 (eight) hours as needed for vomiting or nausea. 06/25/22  Yes Arnell Asal, NP  polyethylene glycol (MIRALAX / GLYCOLAX) 17 g packet Take 17 g by mouth 2 (two) times daily as needed. 02/19/22  Yes Sherwood Gambler, MD  enoxaparin (LOVENOX) 40 MG/0.4ML injection Inject 0.4 mLs (40 mg total) into the skin daily for 28 days. Patient not taking: Reported on 04/16/2022 03/31/22 04/28/22  Antonieta Pert, MD  escitalopram (LEXAPRO) 20 MG tablet Take 20 mg by mouth daily. Patient not taking: Reported on 08/10/2022    [provider]  fentaNYL (DURAGESIC) 25 MCG/HR 1 patch every 3 (three) days. Patient not taking: Reported on 08/17/2022 04/15/22   [provider]  HYDROcodone-acetaminophen (NORCO/VICODIN) 5-325 MG tablet Take 1 tablet by mouth every 6 (six) hours as needed. Patient not taking: Reported on 08/11/2022 06/03/22   [provider]  potassium chloride (KLOR-CON) 10 MEQ tablet Take 10 mEq by mouth daily. Patient not taking: Reported on 08/23/2022 06/06/22   [provider]  prochlorperazine (COMPAZINE) 10 MG tablet Take 1 tablet (10 mg total) by mouth every six (6) hours as needed (nausea). Patient not taking: Reported on 08/09/2022 04/29/22   [provider]  promethazine (PHENERGAN) 25 MG tablet Take 25 mg by mouth every 6 (six) hours as needed. Patient not taking: Reported on 08/23/2022 04/02/22   [provider]    Inpatient Medications: Scheduled Meds:  sodium chloride flush  3 mL Intravenous Q12H   sodium chloride flush  3 mL Intravenous Q12H   Continuous Infusions:  sodium chloride 100 mL/hr at 08/01/22 0731   sodium chloride     heparin 650 Units/hr (08/01/22 0732)   PRN  Meds: sodium chloride, acetaminophen **OR** acetaminophen, bisacodyl, hydrALAZINE, HYDROmorphone (DILAUDID) injection, ipratropium, levalbuterol, LORazepam, ondansetron **OR** ondansetron (ZOFRAN) IV, oxyCODONE, senna-docusate, sodium chloride flush, sodium phosphate, traZODone  Allergies:    Allergies  Allergen Reactions   Contrast Media [Iodinated Contrast Media] Shortness Of Breath   Other     States her blood pressure medicine made her cough    Allevess [Capsaicin-Menthol] Rash   Erythromycin Rash   Naproxen Rash    Social History:   Social History   Socioeconomic History   Marital status: Married    Spouse name: Not on file   Number of children: Not on file   Years of education: Not on file   Highest education level: Not on file  Occupational History   Not on file  Tobacco Use   Smoking status: Former   Smokeless tobacco: Never  Substance and Sexual Activity   Alcohol use: No   Drug use: No   Sexual activity: Not on file  Other Topics Concern   Not on file  Social  History Narrative   Not on file   Social Determinants of Health   Financial Resource Strain: Not on file  Food Insecurity: No Food Insecurity (03/26/2022)   Hunger Vital Sign    Worried About Running Out of Food in the Last Year: Never true    Ran Out of Food in the Last Year: Never true  Transportation Needs: No Transportation Needs (03/26/2022)   PRAPARE - Hydrologist (Medical): No    Lack of Transportation (Non-Medical): No  Physical Activity: Not on file  Stress: Not on file  Social Connections: Not on file  Intimate Partner Violence: Not At Risk (03/26/2022)   Humiliation, Afraid, Rape, and Kick questionnaire    Fear of Current or Ex-Partner: No    Emotionally Abused: No    Physically Abused: No    Sexually Abused: No    Family History:   History reviewed. No pertinent family history.   ROS:  Please see the history of present illness.   All other ROS  reviewed and negative.     Physical Exam/Data:   Vitals:   08/01/22 1203 08/01/22 1328 08/01/22 1430 08/01/22 1556  BP:   115/76 113/82  Pulse:   (!) 102 (!) 118  Resp:   19 (!) 24  Temp: 98.2 F (36.8 C)   98.8 F (37.1 C)  TempSrc: Oral   Oral  SpO2:  100%  94%  Weight:    48.6 kg  Height:        Intake/Output Summary (Last 24 hours) at 08/01/2022 1603 Last data filed at 08/01/2022 0345 Gross per 24 hour  Intake 1073.04 ml  Output --  Net 1073.04 ml      08/01/2022    3:56 PM 08/17/2022    7:03 PM 03/26/2022    3:09 PM  Last 3 Weights  Weight (lbs) 107 lb 2.3 oz 100 lb 119 lb  Weight (kg) 48.6 kg 45.36 kg 53.978 kg     Body mass index is 20.93 kg/m.  General:  working to breath HEENT: normal Neck: no JVD Vascular: No carotid bruits; Distal pulses 2+ bilaterally Cardiac:  normal S1, S2; tachy, no murmur  Lungs:  increased wob, diminished BS BL Abd: soft, nontender, no hepatomegaly  Ext: no edema Musculoskeletal:  No deformities, BUE and BLE strength normal and equal Skin: warm and dry  Neuro:  CNs 2-12 intact, no focal abnormalities noted Psych:  Normal affect   EKG:  The EKG was personally reviewed and demonstrates:   Sinus tachycardia , LBBB   Telemetry:  Telemetry was personally reviewed and demonstrates:  sinus rhythm  Relevant CV Studies:   Laboratory Data:  High Sensitivity Troponin:   Recent Labs  Lab 08/01/22 0324 08/01/22 0916 08/01/22 1158  TROPONINIHS 570* 552* 344*     Chemistry Recent Labs  Lab 08/14/2022 2000 08/01/22 0835  NA 129*  --   K 3.8  --   CL 93*  --   CO2 24  --   GLUCOSE 105*  --   BUN 14  --   CREATININE 0.44  --   CALCIUM 8.3*  --   MG  --  1.7  GFRNONAA >60  --   ANIONGAP 12  --     Recent Labs  Lab 08/25/2022 2000  PROT 7.2  ALBUMIN 2.8*  AST 38  ALT 16  ALKPHOS 129*  BILITOT 0.9   Lipids No results for input(s): "CHOL", "TRIG", "HDL", "LABVLDL", "LDLCALC", "CHOLHDL" in  the last 168 hours.   Hematology Recent Labs  Lab 08/18/2022 2000  WBC 9.1  RBC 4.73  HGB 12.7  HCT 39.3  MCV 83.1  MCH 26.8  MCHC 32.3  RDW 14.6  PLT 489*   Thyroid  Recent Labs  Lab 08/01/22 0916  TSH 4.423    BNP Recent Labs  Lab 08/01/22 0324  BNP 460.0*    DDimer No results for input(s): "DDIMER" in the last 168 hours.   Radiology/Studies:  [facility] Chest Port 1 View  Result Date: 08/01/2022 CLINICAL DATA:  Shortness of breath. EXAM: PORTABLE CHEST 1 VIEW COMPARISON:  08/22/2022. FINDINGS: The heart size and mediastinal contours are stable. There is atherosclerotic calcification of the aorta. Multiple nodules and masses are present in the lungs bilaterally, not significantly changed from the prior exam. No effusion or pneumothorax. A left chest port is stable in position. There is bony deformity of the medial right clavicle, compatible with known metastasis. IMPRESSION: Multiple nodules and masses in the lungs and right clavicular lesion, compatible with known metastatic disease and unchanged from the prior exam. Electronically Signed   By: Brett Fairy M.D.   On: 08/01/2022 02:33   CT Soft Tissue Neck Wo Contrast  Result Date: 08/01/2022 CLINICAL DATA:  Difficulty swallowing history of metastatic colon cancer EXAM: CT NECK WITHOUT CONTRAST TECHNIQUE: Multidetector CT imaging of the neck was performed following the standard protocol without intravenous contrast. RADIATION DOSE REDUCTION: This exam was performed according to the departmental dose-optimization program which includes automated exposure control, adjustment of the mA and/or kV according to patient size and/or use of iterative reconstruction technique. COMPARISON:  None Available. FINDINGS: Pharynx and larynx: Normal. No mass or swelling. Salivary glands: No inflammation, mass, or stone. Thyroid: Normal. Lymph nodes: None enlarged or abnormal density. Vascular: Atherosclerotic calcification at the aortic arch and carotid bifurcations. Limited  intracranial: Negative. Visualized orbits: Negative. Mastoids and visualized paranasal sinuses: Clear. Skeleton: Sclerotic lesion of the right lateral mass of C1, likely a bone island. Large mass of the medial right clavicle with large soft tissue component. Upper chest: Multiple apical pulmonary masses, better characterized on concomitant chest CT. Other: None IMPRESSION: 1. No finding to explain the reported dysphagia. 2. Large mass of the medial right clavicle with large soft tissue component, consistent with metastatic disease. 3. Multiple apical pulmonary masses, better characterized on concomitant chest CT. Aortic Atherosclerosis (ICD10-I70.0). Electronically Signed   By: Ulyses Jarred M.D.   On: 08/01/2022 00:19   CT Chest Wo Contrast  Result Date: 08/01/2022 CLINICAL DATA:  Colon cancer to assess treatment response. Difficulty swallowing for 2 weeks. EXAM: CT CHEST WITHOUT CONTRAST TECHNIQUE: Multidetector CT imaging of the chest was performed following the standard protocol without IV contrast. RADIATION DOSE REDUCTION: This exam was performed according to the departmental dose-optimization program which includes automated exposure control, adjustment of the mA and/or kV according to patient size and/or use of iterative reconstruction technique. COMPARISON:  06/17/2022 FINDINGS: Cardiovascular: Central venous catheter with tip at the cavoatrial junction. Normal heart size. No pericardial effusions. Normal caliber thoracic aorta. Calcification of the aorta and coronary arteries. Mediastinum/Nodes: Thyroid gland is unremarkable. Esophagus is decompressed. Bulky lymphadenopathy throughout the mediastinum and in both hilar regions. Largest lymph nodes are seen in the right paratracheal region, measuring 3.5 cm in diameter. This is similar to prior study. Lungs/Pleura: Multiple bilateral pulmonary metastasis again demonstrated. Individual metastatic lesions measure up to about 4 cm diameter. Measurements are  similar to previous study although overall  the tumor burden appears to be increasing. Previous cavitary areas are no longer demonstrating cavitation. No pleural effusion. No pneumothorax. Upper Abdomen: Large mass in the left suprarenal space measuring up to 6.2 cm diameter probably representing an adrenal gland mass although could be a lymph node. Measurement is mildly enlarged since prior study. Right adrenal gland nodule measuring 2.4 cm diameter. Calcified granulomas in the spleen. Musculoskeletal: A large destructive and sclerotic metastatic focus is demonstrated to involve the head of the clavicle on the right. This measures about 6.5 cm maximal diameter, mildly enlarged since prior study. Compression of the T11 vertebra is unchanged. IMPRESSION: Diffuse metastatic disease, including numerous bilateral pulmonary metastasis, prominent mediastinal lymphadenopathy, bilateral adrenal gland metastases, and large right clavicular lesion. Some lesions appear mildly enlarged since prior study. Electronically Signed   By: Lucienne Capers M.D.   On: 08/01/2022 00:17   DG Chest 2 View  Result Date: 08/18/2022 CLINICAL DATA:  Short of breath, history of metastatic rectal cancer, concern for foreign body EXAM: CHEST - 2 VIEW COMPARISON:  06/18/2022 FINDINGS: Frontal and lateral views of the chest demonstrate a stable left chest wall port via internal jugular approach tip within the region of the superior vena cava. Cardiac silhouette is unchanged. Interval enlargement of the bilateral pulmonary metastases seen on prior exam consistent with progression of disease. No acute airspace disease, effusion, or pneumothorax. Chronic elevation left hemidiaphragm. Chronic compression deformity at the thoracolumbar junction. Stable postsurgical changes left shoulder. IMPRESSION: 1. Enlarging bilateral pulmonary metastases consistent with progression of disease. 2. No evidence of unexpected radiopaque foreign body. Electronically  Signed   By: Randa Ngo M.D.   On: 08/25/2022 19:30     Assessment and Plan:   Mrs. Pointer has metastatic lung disease, no hx of cardiac disease. She has sinus tachycardia. She is dying from metastatic cancer. She is working to breath profusely. There is no recommended cardiac work up for her. I discussed her code status with her. She was very clear. She DOES NOT want to be resuscitated. I attempted to call her daughter who was with her and she gave consent for. I could not reach her. I made Mrs. Lowell DNR. Recommend palliative care/hospice. Otherwise, cardiology will sign off.   Risk Assessment/Risk Scores:       For questions or updates, please contact [place] Please consult [email or web address] for contact info under

## 2022-08-01 NOTE — Progress Notes (Addendum)
ANTICOAGULATION CONSULT NOTE -Follow up   Pharmacy Consult for heparin Indication: chest pain/ACS  Allergies  Allergen Reactions   Contrast Media [Iodinated Contrast Media] Shortness Of Breath   Other     States her blood pressure medicine made her cough    Allevess [Capsaicin-Menthol] Rash   Erythromycin Rash   Naproxen Rash    Patient Measurements: Height: 5' (152.4 cm) Weight: 48.6 kg (107 lb 2.3 oz) IBW/kg (Calculated) : 45.5 Heparin Dosing Weight: 48.6kg  Vital Signs: Temp: 98.8 F (37.1 C) (02/03 1556) Temp Source: Oral (02/03 1556) BP: 113/82 (02/03 1556) Pulse Rate: 118 (02/03 1556)  Labs: Recent Labs    08/26/2022 2000 08/01/22 0324 08/01/22 0835 08/01/22 0916 08/01/22 1158 08/01/22 1610  HGB 12.7  --   --   --   --   --   HCT 39.3  --   --   --   --   --   PLT 489*  --   --   --   --   --   LABPROT  --   --  16.5*  --   --   --   INR  --   --  1.3*  --   --   --   HEPARINUNFRC  --   --   --   --   --  <0.10*  CREATININE 0.44  --   --   --   --   --   TROPONINIHS  --  570*  --  552* 344*  --      Estimated Creatinine Clearance: 45.7 mL/min (by C-G formula based on SCr of 0.44 mg/dL).   Medical History: Past Medical History:  Diagnosis Date   Cancer [facility])    endometrial and colon 2010   Hypertension     Assessment: Pt developed short of breath and increase HR pending discharge. Heparin ordered to r/o MI  The  8 hr HL <0.1 on heparin  650 units/hr. No issues or interruptions with heparin infusion and no bleeding per RN.  RN reports hepairn drip was infusing at correct rate when patient arrived from [facility].  Patient has h/o  colon cancer with metastatic to bone.  Noted patient had acute onset of afib with RVR at [facility] ED.  MD has ordered VQ scan to rule out for PE.  Not on anticoagulation  PTA, although as some point patient was receiving lovenox 40mg  q24h  x28 days (starting 03/31/22)- reported as not taking PTA at [facility].   Goal of Therapy:  Heparin  level 0.3-0.7 units/ml Monitor platelets by anticoagulation protocol: Yes   Plan:  Heparin bolus 2000 units x1 Heparin infusion 800 units/hr Check 8 hr HL and CBC Then daily HL and CBC  Thank you for allowing pharmacy to be part of this patients care team. Nicole Cella, RPh Clinical Pharmacist  08/01/2022 4:52 PM

## 2022-08-01 NOTE — Assessment & Plan Note (Signed)
-   Currently indicates that patient does not take home medication of Lexapro -Will continue with home medication of Ritalin

## 2022-08-01 NOTE — ED Notes (Signed)
Patients daughter updated.

## 2022-08-01 NOTE — Assessment & Plan Note (Signed)
History of rectal cancer-metastases, last evaluation by CT scan 5//2023 -Recurrent history of colon cancer with colectomy -Lung cancer, bone cancer with metastases -Bony involvement to joint

## 2022-08-01 NOTE — ED Notes (Signed)
Spoke with respiratory due to patient stating that she can't breath with the cpap.

## 2022-08-01 NOTE — Sedation Documentation (Signed)
Shock delivered at 120J for Cardioversion. Pt observed on monitor transitioning to ST from Afib.

## 2022-08-01 NOTE — ED Notes (Addendum)
Patient has advanced lung CA , Placed on BiPAP for sudden increase in work of breathing and drop of saturation. Patient did receive liter of fluid?? She does now have craclkles in bases.

## 2022-08-01 NOTE — Assessment & Plan Note (Signed)
-   Acute new onset of A-fib with RVR in the ED overnight -status post cardioversion by EDP -Patient started on heparin drip -Trending troponin elevated to 570  -Obtaining echocardiogram repeat EKG as needed trending troponin  -Cardiology consulted by EDP -Will be admitted to [facility] progressive unit for cardiology evaluation per family request

## 2022-08-01 NOTE — Assessment & Plan Note (Signed)
-   Possibly due to A-fib with RVR, cannot rule out acute PE -Unable to obtain a CT angiogram due to dye allergy -Will obtain a VQ scan -Patient currently on heparin drip -Continue BiPAP -DuoNeb bronchodilators as needed -Plan to wean off supplemental oxygen on BiPAP

## 2022-08-01 NOTE — Assessment & Plan Note (Signed)
Continue PPI ?

## 2022-08-01 NOTE — ED Provider Notes (Signed)
I was asked to evaluate the patient who is awaiting transport home.  Patient seen earlier with dysphagia.  Workup revealed worsening tumor burden secondary to her known metastatic colon cancer.  Nursing staff noted that the patient acutely became very short of breath and her heart rate jumped up to the 160s and asked me to evaluate.  Physical Exam  BP 97/64   Pulse 97   Temp 97.6 F (36.4 C) (Axillary)   Resp (!) 21   Ht 5' (1.524 m)   Wt 45.4 kg   SpO2 98%   BMI 19.53 kg/m   Physical Exam Constitutional:      General: She is in acute distress.  HENT:     Head: Atraumatic.  Eyes:     Pupils: Pupils are equal, round, and reactive to light.  Cardiovascular:     Rate and Rhythm: Tachycardia present. Rhythm irregular.  Pulmonary:     Effort: Tachypnea and accessory muscle usage present.     Breath sounds: Decreased air movement present. Decreased breath sounds present.  Musculoskeletal:     Cervical back: Normal range of motion.     Procedures  .Sedation  Date/Time: 08/01/2022 6:57 AM  Performed by: Orpah Greek, MD Authorized by: Orpah Greek, MD   Consent:    Consent obtained:  Verbal (over phone)   Consent given by:  Healthcare agent (son - Jerel Shepherd)   Risks discussed:  Prolonged hypoxia resulting in organ damage, respiratory compromise necessitating ventilatory assistance and intubation, nausea, vomiting, dysrhythmia and allergic reaction Universal protocol:    Procedure explained and questions answered to patient or proxy's satisfaction: yes     Relevant documents present and verified: yes     Test results available: yes     Imaging studies available: yes     Required blood products, implants, devices, and special equipment available: yes     Site/side marked: yes     Immediately prior to procedure, a time out was called: yes   Indications:    Procedure performed:  Cardioversion   Procedure necessitating sedation performed by:  Physician  performing sedation Pre-sedation assessment:    Time since last food or drink:  >6hr   ASA classification: class 4 - patient with severe systemic disease that is a constant threat to life     Mouth opening:  2 finger widths   Thyromental distance:  3 finger widths   Mallampati score:  Unable to assess (on bipap)   Neck mobility: reduced     Pre-sedation assessments completed and reviewed: airway patency, cardiovascular function, hydration status, mental status, nausea/vomiting, pain level, respiratory function and temperature   Immediate pre-procedure details:    Reviewed: vital signs, relevant labs/tests and NPO status     Verified: bag valve mask available, emergency equipment available, intubation equipment available, IV patency confirmed, oxygen available and reversal medications available   Procedure details (see MAR for exact dosages):    Preoxygenation: BIPAP.   Sedation:  Etomidate   Intended level of sedation: deep   Analgesia:  None   Intra-procedure monitoring:  Blood pressure monitoring, continuous capnometry, frequent LOC assessments, frequent vital sign checks, continuous pulse oximetry and cardiac monitor   Intra-procedure events: none     Total Provider sedation time (minutes):  15 Post-procedure details:    Attendance: Constant attendance by certified staff until patient recovered     Recovery: Patient returned to pre-procedure baseline     Post-sedation assessments completed and reviewed: airway patency, cardiovascular function,  hydration status, mental status, nausea/vomiting, pain level, respiratory function and temperature     Patient is stable for discharge or admission: yes     Procedure completion:  Tolerated well, no immediate complications .Cardioversion  Date/Time: 08/01/2022 7:01 AM  Performed by: Orpah Greek, MD Authorized by: Orpah Greek, MD   Consent:    Consent obtained:  Verbal (over phone)   Consent given by:  Healthcare agent  (son - Jerel Shepherd)   Risks discussed:  Death, induced arrhythmia and pain Universal protocol:    Procedure explained and questions answered to patient or proxy's satisfaction: yes     Relevant documents present and verified: yes     Test results available and properly labeled: yes     Imaging studies available: yes     Required blood products, implants, devices, and special equipment available: yes     Site/side marked: yes     Immediately prior to procedure a time out was called: yes     Patient identity confirmed:  Verbally with patient Pre-procedure details:    Cardioversion basis:  Emergent   Rhythm:  Atrial fibrillation   Electrode placement:  Anterior-posterior Patient sedated: Yes. Refer to sedation procedure documentation for details of sedation.  Attempt one:    Cardioversion mode:  Synchronous   Waveform:  Biphasic   Shock (Joules):  150   Shock outcome:  Conversion to normal sinus rhythm Post-procedure details:    Patient status:  Awake   Patient tolerance of procedure:  Tolerated well, no immediate complications .Critical Care  Performed by: Orpah Greek, MD Authorized by: Orpah Greek, MD   Critical care provider statement:    Critical care time (minutes):  30   Critical care was necessary to treat or prevent imminent or life-threatening deterioration of the following conditions:  Circulatory failure, respiratory failure and CNS failure or compromise   Critical care was time spent personally by me on the following activities:  Development of treatment plan with patient or surrogate, discussions with consultants, evaluation of patient's response to treatment, examination of patient, ordering and review of laboratory studies, ordering and review of radiographic studies, ordering and performing treatments and interventions, pulse oximetry, re-evaluation of patient's condition and review of old charts   I assumed direction of critical care for this  patient from another provider in my specialty: yes     ED Course / MDM    Medical Decision Making Amount and/or Complexity of Data Reviewed Labs: ordered. Radiology: ordered.  Risk Prescription drug management.   Asked see patient who acutely had change in her vital signs.  Encountered patient with severe tachypnea saying "I cannot breathe".  Patient now in atrial fibrillation with rapid ventricular response, reviewed records and she was sinus tachycardia earlier.  Contacted her son, Deardra Hinkley who confirms she has never been in A-fib before.  Initiated on BiPAP for shortness of breath.  She continued to have some anxiety, treated with IV diazepam and she is tolerating BiPAP with normal oxygen saturations.  Added on lab work to evaluate volume status, since first of breath seem to occur at the end of the fluid bolus..  There was a significant delay in obtaining the labs, ultimately troponin elevated BNP somewhat elevated at 460.  A repeat x-ray, however, unchanged from prior.  Around this time patient noted to become hypotensive.  He was felt that the patient required urgent conversion to sinus rhythm based on her vitals.  As we did document the  onset of A-fib, it was felt that she was not high risk for clotting.  Risks and benefits of DC cardioversion discussed with her son, Leelah Hanna.  He indicated that he would like "everything done".  Patient sedated with etomidate and a single shock converted her into sinus rhythm.  She is still somewhat hypotensive, getting IV fluids with improvement.  Troponin returned at 570.  This was drawn prior to cardioversion.  Initiate heparinization to cover for A-fib as well as NSTEMI.  Patient stabilized, will sign out to oncoming ER provider to follow to ensure that she is stable enough for admission prior to calling for admission.       Orpah Greek, MD 08/01/22 [phone]

## 2022-08-01 NOTE — Hospital Course (Signed)
Caitlin Mckinney is a 71 year old Female patient who is apparently under hospice with extensive history of metastatic cancer presenting from skilled nursing facility with chief complaint of shortness of breath, palpitation.  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 Severe debility and SNF     Course ED: Vitals:   08/01/22 0705 08/01/22 0715  BP: 97/61 97/64  Pulse: 97 97  Resp: (!) 21 (!) 21  Temp:    SpO2: 98% 98%  significant worsening SOB and dyspnea, O2 sat dropping to 82% on her baseline 2L. Increased O2 to 4  On BiPAP ABG    Component Value Date/Time   PHART 7.4 08/01/2022 0701   PCO2ART 36 08/01/2022 0701   PO2ART 138 (H) 08/01/2022 0701   HCO3 22.3 08/01/2022 0701   ACIDBASEDEF 2.0 08/01/2022 0701   O2SAT 99.8 08/01/2022 0701   Na 129,  K 3.8,  Trop: 570  -Patient was initially seen and evaluated in ED for mild dysphagia-she suddenly progressed to worsening shortness of breath, heart rate jumped 260 patient went to A-fib with RVR -Subsequently ED staff stabilize the patient with fluid, subsequently cardioversion by EDP provider Dr. Madaline Brilliant -Subsequent workup noted for elevated troponin avidly  -Patient started on heparin drip  -ED providers and I have called patient's son: Who confirmed that now patient is full code, would like to pursue full scope of care including in person cardiac evaluation by cardiologist  It was explained that the patient needs to be transferred to [facility] for cardiology evaluation.  EDP was made aware that cardiology consultation is needed

## 2022-08-01 NOTE — H&P (Addendum)
History and Physical   Patient: Caitlin Mckinney                            PCP: Abran Richard, MD                    DOB: 17-May-1951            DOA: 08/06/2022 UVO:[id number]             DOS: 08/01/2022, 8:51 AM  Abran Richard, MD  Patient coming from:   HOME  I have personally reviewed patient's medical records, in electronic medical records, including:  [facility] link, and care everywhere.    Chief Complaint:   Chief Complaint  Patient presents with   Dysphagia    History of present illness:    Caitlin Mckinney is a 72 year old Female patient who is apparently under hospice with extensive history of metastatic cancer presenting from skilled nursing facility with chief complaint of shortness of breath, palpitation.  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 Severe debility and SNF     Course ED: Vitals:   08/01/22 0705 08/01/22 0715  BP: 97/61 97/64  Pulse: 97 97  Resp: (!) 21 (!) 21  Temp:    SpO2: 98% 98%  significant worsening SOB and dyspnea, O2 sat dropping to 82% on her baseline 2L. Increased O2 to 4  On BiPAP ABG    Component Value Date/Time   PHART 7.4 08/01/2022 0701   PCO2ART 36 08/01/2022 0701   PO2ART 138 (H) 08/01/2022 0701   HCO3 22.3 08/01/2022 0701   ACIDBASEDEF 2.0 08/01/2022 0701   O2SAT 99.8 08/01/2022 0701   Na 129,  K 3.8,  Trop: 570  -Patient was initially seen and evaluated in ED for mild dysphagia-she suddenly progressed to worsening shortness of breath, heart rate jumped 260 patient went to A-fib with RVR -Subsequently ED staff stabilize the patient with fluid, subsequently cardioversion by EDP provider Dr. Madaline Brilliant -Subsequent workup noted for elevated troponin avidly  -Patient started on heparin drip  -ED providers and I have called patient's son: Who confirmed that now patient is  full code, would like to pursue full scope of care including in person cardiac evaluation by cardiologist  It was explained that the patient needs to be transferred to [facility] for cardiology evaluation.  EDP was made aware that cardiology consultation is needed    Patient Denies having: Fever, Chills, Cough, SOB, Chest Pain, Abd pain, N/V/D, headache, dizziness, lightheadedness,  Dysuria, Joint pain, rash, open wounds  ED Course:   Blood pressure 105/72, pulse 96, temperature 97.6 F (36.4 C), temperature source Axillary, resp. rate (!) 22, height 5' (1.524 m), weight 45.4 kg, SpO2 100 %. Abnormal labs;   Review of Systems: As per HPI, otherwise 10 point review of systems were negative.   ----------------------------------------------------------------------------------------------------------------------  Allergies  Allergen Reactions   Contrast Media [Iodinated Contrast Media] Shortness Of Breath   Other     States her blood pressure medicine made her cough    Allevess [Capsaicin-Menthol] Rash   Erythromycin Rash   Naproxen Rash    Home MEDs:  Prior to Admission medications   Medication Sig Start Date End Date Taking? Authorizing Provider  amLODipine (NORVASC) 5 MG tablet Take 5 mg by mouth daily. 09/27/19  Yes [provider]  atenolol (TENORMIN) 50 MG tablet Take 50 mg by mouth daily. 01/20/22  Yes [provider]  docusate sodium (COLACE) 100 MG capsule Take 1 capsule (100 mg total) by mouth every 12 (twelve) hours. 02/19/22  Yes Sherwood Gambler, MD  fluticasone (FLONASE) 50 MCG/ACT nasal spray Place 1 spray into both nostrils daily as needed for allergies or rhinitis.   Yes [provider]  HYDROmorphone (DILAUDID) 2 MG tablet Take 1 tablet (2 mg total) by mouth every 3 (three) hours as needed for up to 3 doses for moderate pain. 03/30/22  Yes Antonieta Pert, MD  loratadine (CLARITIN) 10 MG tablet Take 10 mg by mouth daily.   Yes [provider]  methylphenidate (RITALIN) 10 MG tablet Take 20 mg by mouth every morning.   Yes [provider]  omeprazole (PRILOSEC) 20 MG capsule Take 1 capsule by mouth daily.   Yes [provider]  ondansetron (ZOFRAN) 8 MG tablet Take 1 tablet by mouth twice daily on days 2 TO 3 of each chemotherapy cycle. May take up to 1 tablet every 8 hours as needed, but do not take on day 1 of cycles Patient taking differently: Take 8 mg by mouth every 8 (eight) hours as needed for vomiting or nausea. 06/25/22  Yes Arnell Asal, NP  polyethylene glycol (MIRALAX / GLYCOLAX) 17 g packet Take 17 g by mouth 2 (two) times daily as needed. 02/19/22  Yes Sherwood Gambler, MD  enoxaparin (LOVENOX) 40 MG/0.4ML injection Inject 0.4 mLs (40 mg total) into the skin daily for 28 days. Patient not taking: Reported on 04/16/2022 03/31/22 04/28/22  Antonieta Pert, MD  escitalopram (LEXAPRO) 20 MG tablet Take 20 mg by mouth daily. Patient not taking: Reported on 08/13/2022    [provider]  fentaNYL (DURAGESIC) 25 MCG/HR 1 patch every 3 (three) days. Patient not taking: Reported on 08/15/2022 04/15/22   [provider]  HYDROcodone-acetaminophen (NORCO/VICODIN) 5-325 MG tablet Take 1 tablet by mouth every 6 (six) hours as needed. Patient not taking: Reported on 08/18/2022 06/03/22   [provider]  potassium chloride (KLOR-CON) 10 MEQ tablet Take 10 mEq by mouth daily. Patient not taking: Reported on 08/17/2022 06/06/22   [provider]  prochlorperazine (COMPAZINE) 10 MG tablet Take 1 tablet (10 mg total) by mouth every six (6) hours as needed (nausea). Patient not taking: Reported on 08/05/2022 04/29/22   [provider]  promethazine (PHENERGAN) 25 MG tablet Take 25 mg by mouth every 6 (six) hours as needed. Patient not taking: Reported on 08/10/2022 04/02/22   [provider]    PRN MEDs: sodium chloride, acetaminophen **OR** acetaminophen, bisacodyl, hydrALAZINE,  HYDROmorphone (DILAUDID) injection, ipratropium, levalbuterol, ondansetron **OR** ondansetron (ZOFRAN) IV, oxyCODONE, senna-docusate, sodium chloride flush, sodium phosphate, traZODone  Past Medical History:  Diagnosis Date   Cancer ([place])    endometrial and colon 2010   Hypertension     Past Surgical History:  Procedure Laterality Date   ABDOMINAL HYSTERECTOMY     APPENDECTOMY     COLON RESECTION     COLON SURGERY     COLOSTOMY     COLOSTOMY REVERSAL     HIP PINNING,CANNULATED Right 03/26/2022   Procedure: INTERNAL FIXATION RIGHT HIP;  Surgeon: Carole Civil, MD;  Location: [facility];  Service: Orthopedics;  Laterality: Right;   SHOULDER ARTHROSCOPY     TUBAL LIGATION       reports that she has quit smoking. She has never used  smokeless tobacco. She reports that she does not drink alcohol and does not use drugs.   History reviewed. No pertinent family history.  Physical Exam:   Vitals:   08/01/22 0715 08/01/22 0730 08/01/22 0800 08/01/22 0830  BP: 97/64 104/69 104/69 105/72  Pulse: 97 97 (!) 101 96  Resp: (!) 21 (!) 36 (!) 27 (!) 22  Temp:      TempSrc:      SpO2: 98% 99% 100% 100%  Weight:      Height:       Constitutional: Awake on BiPAP Eyes: PERRL, lids and conjunctivae normal ENMT: Mucous membranes are moist. Posterior pharynx clear of any exudate or lesions.Normal dentition.  Neck: normal, supple, no masses, no thyromegaly Respiratory: clear to auscultation bilaterally, no wheezing, no crackles. Normal respiratory effort. No accessory muscle use.  Cardiovascular: Regular rate and rhythm, no murmurs / rubs / gallops. No extremity edema. 2+ pedal pulses. No carotid bruits.  Abdomen: no tenderness, no masses palpated. No hepatosplenomegaly. Bowel sounds positive.  Musculoskeletal: Upper thorax bony deformities, due to cancer  no clubbing / cyanosis. No joint deformity upper and lower extremities. Good ROM, no contractures. Normal muscle tone.  Neurologic: CN  II-XII grossly intact. Sensation intact, DTR normal. Strength 5/5 in all 4.  Psychiatric: Normal judgment and insight. Alert and oriented x 3. Normal mood.  Skin: no rashes, lesions, ulcers. No induration  Wounds: per nursing documentation         Labs on admission:    I have personally reviewed following labs and imaging studies  CBC: Recent Labs  Lab 08/05/2022 2000  WBC 9.1  NEUTROABS 7.2  HGB 12.7  HCT 39.3  MCV 83.1  PLT 960*   Basic Metabolic Panel: Recent Labs  Lab 08/14/2022 2000  NA 129*  K 3.8  CL 93*  CO2 24  GLUCOSE 105*  BUN 14  CREATININE 0.44  CALCIUM 8.3*   GFR: Estimated Creatinine Clearance: 45.6 mL/min (by C-G formula based on SCr of 0.44 mg/dL). Liver Function Tests: Recent Labs  Lab 08/05/2022 2000  AST 38  ALT 16  ALKPHOS 129*  BILITOT 0.9  PROT 7.2  ALBUMIN 2.8*   No results for input(s): "LIPASE", "AMYLASE" in the last 168 hours. No results for input(s): "AMMONIA" in the last 168 hours. Coagulation Profile: No results for input(s): "INR", "PROTIME" in the last 168 hours. Cardiac Enzymes: No results for input(s): "CKTOTAL", "CKMB", "CKMBINDEX", "TROPONINI" in the last 168 hours. BNP (last 3 results) No results for input(s): "PROBNP" in the last 8760 hours. HbA1C: No results for input(s): "HGBA1C" in the last 72 hours. CBG: No results for input(s): "GLUCAP" in the last 168 hours. Lipid Profile: No results for input(s): "CHOL", "HDL", "LDLCALC", "TRIG", "CHOLHDL", "LDLDIRECT" in the last 72 hours. Thyroid Function Tests: No results for input(s): "TSH", "T4TOTAL", "FREET4", "T3FREE", "THYROIDAB" in the last 72 hours. Anemia Panel: No results for input(s): "VITAMINB12", "FOLATE", "FERRITIN", "TIBC", "IRON", "RETICCTPCT" in the last 72 hours. Urine analysis:    Component Value Date/Time   COLORURINE STRAW (A) 03/26/2022 0240   APPEARANCEUR CLEAR 03/26/2022 0240   LABSPEC 1.004 (L) 03/26/2022 0240   PHURINE 7.0 03/26/2022 0240    GLUCOSEU NEGATIVE 03/26/2022 0240   HGBUR NEGATIVE 03/26/2022 0240   BILIRUBINUR NEGATIVE 03/26/2022 0240   KETONESUR NEGATIVE 03/26/2022 0240   PROTEINUR NEGATIVE 03/26/2022 0240   NITRITE NEGATIVE 03/26/2022 0240   LEUKOCYTESUR TRACE (A) 03/26/2022 0240    Last A1C:  No results found for: "HGBA1C"  Radiologic Exams on Admission:   DG Chest Port 1 View  Result Date: 08/01/2022 CLINICAL DATA:  Shortness of breath. EXAM: PORTABLE CHEST 1 VIEW COMPARISON:  08/15/2022. FINDINGS: The heart size and mediastinal contours are stable. There is atherosclerotic calcification of the aorta. Multiple nodules and masses are present in the lungs bilaterally, not significantly changed from the prior exam. No effusion or pneumothorax. A left chest port is stable in position. There is bony deformity of the medial right clavicle, compatible with known metastasis. IMPRESSION: Multiple nodules and masses in the lungs and right clavicular lesion, compatible with known metastatic disease and unchanged from the prior exam. Electronically Signed   By: Brett Fairy M.D.   On: 08/01/2022 02:33   CT Soft Tissue Neck Wo Contrast  Result Date: 08/01/2022 CLINICAL DATA:  Difficulty swallowing history of metastatic colon cancer EXAM: CT NECK WITHOUT CONTRAST TECHNIQUE: Multidetector CT imaging of the neck was performed following the standard protocol without intravenous contrast. RADIATION DOSE REDUCTION: This exam was performed according to the departmental dose-optimization program which includes automated exposure control, adjustment of the mA and/or kV according to patient size and/or use of iterative reconstruction technique. COMPARISON:  None Available. FINDINGS: Pharynx and larynx: Normal. No mass or swelling. Salivary glands: No inflammation, mass, or stone. Thyroid: Normal. Lymph nodes: None enlarged or abnormal density. Vascular: Atherosclerotic calcification at the aortic arch and carotid bifurcations. Limited  intracranial: Negative. Visualized orbits: Negative. Mastoids and visualized paranasal sinuses: Clear. Skeleton: Sclerotic lesion of the right lateral mass of C1, likely a bone island. Large mass of the medial right clavicle with large soft tissue component. Upper chest: Multiple apical pulmonary masses, better characterized on concomitant chest CT. Other: None IMPRESSION: 1. No finding to explain the reported dysphagia. 2. Large mass of the medial right clavicle with large soft tissue component, consistent with metastatic disease. 3. Multiple apical pulmonary masses, better characterized on concomitant chest CT. Aortic Atherosclerosis (ICD10-I70.0). Electronically Signed   By: Ulyses Jarred M.D.   On: 08/01/2022 00:19   CT Chest Wo Contrast  Result Date: 08/01/2022 CLINICAL DATA:  Colon cancer to assess treatment response. Difficulty swallowing for 2 weeks. EXAM: CT CHEST WITHOUT CONTRAST TECHNIQUE: Multidetector CT imaging of the chest was performed following the standard protocol without IV contrast. RADIATION DOSE REDUCTION: This exam was performed according to the departmental dose-optimization program which includes automated exposure control, adjustment of the mA and/or kV according to patient size and/or use of iterative reconstruction technique. COMPARISON:  06/17/2022 FINDINGS: Cardiovascular: Central venous catheter with tip at the cavoatrial junction. Normal heart size. No pericardial effusions. Normal caliber thoracic aorta. Calcification of the aorta and coronary arteries. Mediastinum/Nodes: Thyroid gland is unremarkable. Esophagus is decompressed. Bulky lymphadenopathy throughout the mediastinum and in both hilar regions. Largest lymph nodes are seen in the right paratracheal region, measuring 3.5 cm in diameter. This is similar to prior study. Lungs/Pleura: Multiple bilateral pulmonary metastasis again demonstrated. Individual metastatic lesions measure up to about 4 cm diameter. Measurements are  similar to previous study although overall the tumor burden appears to be increasing. Previous cavitary areas are no longer demonstrating cavitation. No pleural effusion. No pneumothorax. Upper Abdomen: Large mass in the left suprarenal space measuring up to 6.2 cm diameter probably representing an adrenal gland mass although could be a lymph node. Measurement is mildly enlarged since prior study. Right adrenal gland nodule measuring 2.4 cm diameter. Calcified granulomas in the spleen. Musculoskeletal: A large destructive and sclerotic metastatic focus is demonstrated to  involve the head of the clavicle on the right. This measures about 6.5 cm maximal diameter, mildly enlarged since prior study. Compression of the T11 vertebra is unchanged. IMPRESSION: Diffuse metastatic disease, including numerous bilateral pulmonary metastasis, prominent mediastinal lymphadenopathy, bilateral adrenal gland metastases, and large right clavicular lesion. Some lesions appear mildly enlarged since prior study. Electronically Signed   By: Lucienne Capers M.D.   On: 08/01/2022 00:17   DG Chest 2 View  Result Date: 08/09/2022 CLINICAL DATA:  Short of breath, history of metastatic rectal cancer, concern for foreign body EXAM: CHEST - 2 VIEW COMPARISON:  06/18/2022 FINDINGS: Frontal and lateral views of the chest demonstrate a stable left chest wall port via internal jugular approach tip within the region of the superior vena cava. Cardiac silhouette is unchanged. Interval enlargement of the bilateral pulmonary metastases seen on prior exam consistent with progression of disease. No acute airspace disease, effusion, or pneumothorax. Chronic elevation left hemidiaphragm. Chronic compression deformity at the thoracolumbar junction. Stable postsurgical changes left shoulder. IMPRESSION: 1. Enlarging bilateral pulmonary metastases consistent with progression of disease. 2. No evidence of unexpected radiopaque foreign body. Electronically  Signed   By: Randa Ngo M.D.   On: 08/02/2022 19:30    EKG:   Independently reviewed.  Orders placed or performed during the hospital encounter of 08/19/2022   ED EKG   ED EKG   ED EKG   ED EKG   EKG 12-Lead   ---------------------------------------------------------------------------------------------------------------------------------------    Assessment / Plan:   Principal Problem:   Acute respiratory failure with hypoxia (HCC) Active Problems:   Essential hypertension   Mood disorder (HCC)   GERD (gastroesophageal reflux disease)   Cancer related pain   Metastasis to bone (HCC)   Rectal cancer (HCC)   A-fib (HCC)   Assessment and Plan: * Acute respiratory failure with hypoxia (HCC) - Possibly due to A-fib with RVR, cannot rule out acute PE -Unable to obtain a CT angiogram due to dye allergy -Will obtain a VQ scan -Patient currently on heparin drip -Continue BiPAP -DuoNeb bronchodilators as needed -Plan to wean off supplemental oxygen on BiPAP  A-fib ([place]) - Acute new onset of A-fib with RVR in the ED overnight -status post cardioversion by EDP -Patient started on heparin drip -Trending troponin elevated to 570  -Obtaining echocardiogram repeat EKG as needed trending troponin  -Cardiology consulted by EDP -[facility] be admitted to [facility] progressive unit for cardiology evaluation per family request    Rectal cancer [facility]) History of rectal cancer-metastases, last evaluation by CT scan 5//2023 -Recurrent history of colon cancer with colectomy -Lung cancer, bone cancer with metastases -Bony involvement to joint  Metastasis to bone ([place]) History of lung cancer with bone metastases -Chart indicate bone cancer, colon cancer with history of colectomy -It appears patient has getting any treatment at this point  -Palliative care consulted stat to reevaluate and establish goals of care and CODE STATUS  -Patient will family will like to resume care  follow-up with oncologist as an outpatient  Cancer related pain -As needed p.o. analgesics  -Along with a as needed IV analgesics-Dilaudid  GERD (gastroesophageal reflux disease) - Continue PPI  Mood disorder (HCC) - Currently indicates that patient does not take home medication of Lexapro -Will continue with home medication of Ritalin  Essential hypertension - Monitoring BP closely, with holding medication at this point -As needed hydralazine     Ethics: Converted to full code per family request  I have called patient's son at  [phone] Who wishes for the patient to be full code- Full scope of care including in person cardiology consultation  I believe patient is signs request is unrealistic as patient was under hospice care now in A-fib with RVR, and respiratory distress--due to significant comorbidities and dye allergy patient is not a candidate for any intervention  Palliative care -consulted appreciate further follow-up to determine goals of care and CODE STATUS     Consults called: Palliative care team called/cardiology -------------------------------------------------------------------------------------------------------------------------------------------- DVT prophylaxis:  TED hose Start: 08/01/22 0813 SCDs Start: 08/01/22 0813   Code Status:   Code Status: Full Code   Admission status: Patient will be admitted as Inpatient, with a greater than 2 midnight length of stay. Level of care: Progressive   Family Communication: Rest with the patient's son who is POA on the phone (The above findings and plan of care has been discussed with patient in detail, the patient expressed understanding and agreement of above plan)  --------------------------------------------------------------------------------------------------------------------------------------------------  Disposition Plan:  Anticipated 1-2 days Status is: Inpatient Remains inpatient appropriate  because: Needing heparin drip, cardiology consultation     ----------------------------------------------------------------------------------------------------------------------------------------------------  Time spent: > than  75  Min.  Was spent seeing and evaluating the patient, reviewing all medical records, drawn plan of care.  SIGNED: Deatra James, MD, FHM. FAAFP. [facility], Pager  (Please use [email or web address] to page/ or secure chat through epic) If 7PM-7AM, please contact night-coverage [email or web address],  08/01/2022, 8:51 AM

## 2022-08-01 NOTE — Assessment & Plan Note (Signed)
History of lung cancer with bone metastases -Chart indicate bone cancer, colon cancer with history of colectomy -It appears patient has getting any treatment at this point  -Palliative care consulted stat to reevaluate and establish goals of care and CODE STATUS  -Patient will family will like to resume care follow-up with oncologist as an outpatient

## 2022-08-02 DIAGNOSIS — Z515 Encounter for palliative care: Secondary | ICD-10-CM | POA: Diagnosis not present

## 2022-08-02 DIAGNOSIS — Z66 Do not resuscitate: Secondary | ICD-10-CM | POA: Diagnosis not present

## 2022-08-02 DIAGNOSIS — Z7189 Other specified counseling: Secondary | ICD-10-CM

## 2022-08-02 DIAGNOSIS — C7951 Secondary malignant neoplasm of bone: Secondary | ICD-10-CM

## 2022-08-02 LAB — CBC
HCT: 36.4 % (ref 36.0–46.0)
Hemoglobin: 12.1 g/dL (ref 12.0–15.0)
MCH: 27.7 pg (ref 26.0–34.0)
MCHC: 33.2 g/dL (ref 30.0–36.0)
MCV: 83.3 fL (ref 80.0–100.0)
Platelets: 385 10*3/uL (ref 150–400)
RBC: 4.37 MIL/uL (ref 3.87–5.11)
RDW: 14.8 % (ref 11.5–15.5)
WBC: 12.7 10*3/uL — ABNORMAL HIGH (ref 4.0–10.5)
nRBC: 0 % (ref 0.0–0.2)

## 2022-08-02 LAB — BASIC METABOLIC PANEL
Anion gap: 12 (ref 5–15)
BUN: 16 mg/dL (ref 8–23)
CO2: 20 mmol/L — ABNORMAL LOW (ref 22–32)
Calcium: 8.1 mg/dL — ABNORMAL LOW (ref 8.9–10.3)
Chloride: 101 mmol/L (ref 98–111)
Creatinine, Ser: 0.73 mg/dL (ref 0.44–1.00)
GFR, Estimated: >60 mL/min (ref >60)
Glucose, Bld: 83 mg/dL (ref 70–99)
Potassium: 3.5 mmol/L (ref 3.5–5.1)
Sodium: 133 mmol/L — ABNORMAL LOW (ref 135–145)

## 2022-08-02 LAB — GLUCOSE, CAPILLARY
Glucose-Capillary: 82 mg/dL (ref 70–99)
Glucose-Capillary: 76 mg/dL (ref 70–99)

## 2022-08-02 LAB — APTT: aPTT: 44 seconds — ABNORMAL HIGH (ref 24–36)

## 2022-08-02 LAB — HEPARIN LEVEL (UNFRACTIONATED)
Heparin Unfractionated: <0.1 IU/mL — ABNORMAL LOW (ref 0.30–0.70)
Heparin Unfractionated: <0.1 IU/mL — ABNORMAL LOW (ref 0.30–0.70)

## 2022-08-02 LAB — PROTIME-INR
INR: 1.3 — ABNORMAL HIGH (ref 0.8–1.2)
Prothrombin Time: 16.2 seconds — ABNORMAL HIGH (ref 11.4–15.2)

## 2022-08-02 MED ORDER — HYDROMORPHONE HCL 1 MG/ML IJ SOLN
0.5000 mg | INTRAMUSCULAR | Status: DC | PRN
  Administered 2022-08-02: 1 mg via INTRAVENOUS
  Filled 2022-08-02: qty 1

## 2022-08-02 MED ORDER — HALOPERIDOL LACTATE 5 MG/ML IJ SOLN: 0.5000 mg | INTRAMUSCULAR | Status: DC | PRN

## 2022-08-02 MED ORDER — CHLORHEXIDINE GLUCONATE CLOTH 2 % EX PADS
6.0000 | MEDICATED_PAD | Freq: Every day | CUTANEOUS | Status: DC
  Administered 2022-08-02: 6 via TOPICAL

## 2022-08-02 MED ORDER — GLYCOPYRROLATE 1 MG PO TABS: 1.0000 mg | ORAL_TABLET | ORAL | Status: DC | PRN

## 2022-08-02 MED ORDER — GLYCOPYRROLATE 0.2 MG/ML IJ SOLN: 0.2000 mg | INTRAMUSCULAR | Status: DC | PRN

## 2022-08-02 MED ORDER — POLYVINYL ALCOHOL 1.4 % OP SOLN
1.0000 [drp] | Freq: Four times a day (QID) | OPHTHALMIC | Status: DC | PRN
  Filled 2022-08-02: qty 15

## 2022-08-02 MED ORDER — HEPARIN BOLUS VIA INFUSION
2500.0000 [IU] | Freq: Once | INTRAVENOUS | Status: AC
  Administered 2022-08-02: 2500 [IU] via INTRAVENOUS
  Filled 2022-08-02: qty 2500

## 2022-08-02 MED ORDER — HALOPERIDOL LACTATE 2 MG/ML PO CONC: 0.5000 mg | ORAL | Status: DC | PRN

## 2022-08-02 MED ORDER — SODIUM CHLORIDE 0.9% FLUSH: 10.0000 mL | INTRAVENOUS | Status: DC | PRN

## 2022-08-02 MED ORDER — HYDROMORPHONE HCL 1 MG/ML IJ SOLN
0.5000 mg | INTRAMUSCULAR | Status: DC
  Administered 2022-08-02 – 2022-08-03 (×3): 0.5 mg via INTRAVENOUS
  Filled 2022-08-02 (×4): qty 1

## 2022-08-02 MED ORDER — LORAZEPAM 2 MG/ML IJ SOLN
0.5000 mg | INTRAMUSCULAR | Status: DC | PRN
  Administered 2022-08-02: 1 mg via INTRAVENOUS
  Administered 2022-08-02: 0.5 mg via INTRAVENOUS
  Administered 2022-08-03: 1 mg via INTRAVENOUS
  Filled 2022-08-02 (×3): qty 1

## 2022-08-02 MED ORDER — SODIUM CHLORIDE 0.9% FLUSH: 10.0000 mL | Freq: Two times a day (BID) | INTRAVENOUS | Status: DC

## 2022-08-02 MED ORDER — BIOTENE DRY MOUTH MT LIQD: 15.0000 mL | OROMUCOSAL | Status: DC | PRN

## 2022-08-02 MED ORDER — LACTATED RINGERS IV BOLUS
500.0000 mL | Freq: Once | INTRAVENOUS | Status: AC
  Administered 2022-08-02: 500 mL via INTRAVENOUS

## 2022-08-02 NOTE — Plan of Care (Signed)
  Problem: Coping: Goal: Ability to identify and develop effective coping behavior will improve Outcome: Progressing   Problem: Role Relationship: Goal: Family's ability to cope with current situation will improve Outcome: Progressing Goal: Ability to verbalize concerns, feelings, and thoughts to partner or family member will improve Outcome: Progressing

## 2022-08-02 NOTE — Consult Note (Cosign Needed Addendum)
Palliative Medicine Inpatient Consult Note  Consulting Provider: Cicero Duck, RN   Reason for consult:   [place] Palliative Medicine Consult   Symptom Management Consult  Reason for Consult? Goals of care   08/02/2022  HPI:  Per intake H&P --> Caitlin Mckinney is a 72 year old Female patient who is apparently under hospice with extensive history of metastatic cancer presenting from skilled nursing facility with chief complaint of shortness of breath, palpitation.   Palliative care was asked to get involved once transferred to [facility] in the setting of severe disease burden to clarify goals.   Clinical Assessment/Goals of Care:  *Please note that this is a verbal dictation therefore any spelling or grammatical errors are due to the "[place]" system interpretation.  I have reviewed medical records including EPIC notes, labs and imaging, received report from bedside RN, assessed the patient who is resting comfortably.    I met with  patients daughter, Caitlin Mckinney to further discuss diagnosis prognosis, GOC, EOL wishes, disposition and options.   I introduced Palliative Medicine as specialized medical care for people living with serious illness. It focuses on providing relief from the symptoms and stress of a serious illness. The goal is to improve quality of life for both the patient and the family.  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  Social History:  Discussed that patient is originally from [place]. She moved around quite a bit throughout her life. She came to [facility] as her sister had lived here. She was married though her husband passed away about two years ago. She had five children though one son has passed away. She has six grandchildren. 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 She  is a woman of faith and practices within Christianity.   Functional and Nutritional State:  Patient had been able to perform care tasks for herself - bADLs. She had a poor appetite overall.   Advance Directives:  A detailed discussion was had today regarding advanced directives.  Patient has no formal directives though her daughter, Caitlin Mckinney has a written piece of paper directing her for decisions.  Code Status:  Concepts specific to code status, artifical feeding and hydration, continued IV antibiotics and rehospitalization was had.  The difference between a aggressive medical intervention path  and a palliative comfort care path for this patient at this time was had.   Encouraged patient/family to consider DNAR status understanding evidenced based poor outcomes in similar hospitalized patient, as the cause of arrest is likely associated with advanced chronic/terminal illness rather than an easily reversible acute cardio-pulmonary event. I explained that DNAR which daughter is in full agreement with.   Daughter shares she is okay with intubation.  I provided education on the reasons why we would not intubate a patient during their end of life journey. Patients daughter understood this.   Discussion:  Discussed with Caitlin Mckinney that Caitlin Mckinney has "suffer for a long time" in the setting of the loss of her spouse and son. She has per her daughter overcome many obstacles from a health perspective. She shares that in recent months she had not been doing as well and overall wakening. She has lost weight and has been enrolled in hospice care though Caitlin Mckinney cannot recall which one.   We discussed openly and honestly the reality that Caitlin Mckinney's body is shutting down. I shared  that I worry additional interventions are only prolonging the inevitable and prolonging undue suffering. Patients family was understanding of this.   We discussed that at this point we can offer support of symptoms as patient transitions  from this life to the next.   We talked about transition to comfort measures in house and what that would entail inclusive of medications to control pain, dyspnea, agitation, nausea, itching, and hiccups.   We discussed stopping all uneccessary measures such as cardiac monitoring, blood draws, needle sticks, and frequent vital signs. Utilized reflective listening throughout our time together.  Determined that emphasis will be switched to comfort, family will visits, medications will be scheduled every four hours for symptoms.   I shared that [place] would like pass in the oncoming hours days. Caitlin Mckinney agrees to maximizing comfort.   Discussed the importance of continued conversation with family and their  medical providers regarding overall plan of care and treatment options, ensuring decisions are within the context of the patients values and GOCs. _______________________________________ Addendum:  Checked in on patient this evening and spoke with her daughter, Caitlin Mckinney. Explained symptom relief and the importance of continue ATC medications to prevent possible air hunger. Patients daughter understood and was thankful for this explanation.   Offered support given how difficult this situation is for the patient and her daughter.   Decision Maker: Hill,Angela (Daughter): [phone] (Mobile)   SUMMARY OF RECOMMENDATIONS   DNAR/DNI  Comfort care  Dilaudid Q4H ATC  Ongoing symptom support per [facility]  PMT will continue to follow along  Anticipate in hospital passing within hours to days  Code Status/Advance Care Planning: DNAR/DNI  Palliative Prophylaxis:  Aspiration, Bowel Regimen, Delirium Protocol, Frequent Pain Assessment, Oral Care, Palliative Wound Care, and Turn Reposition  Additional Recommendations (Limitations, Scope, Preferences): Continue current care geared towards comfort  Psycho-social/Spiritual:  Desire for further Chaplaincy support: Yes - patient is  [place] Additional Recommendations:    Prognosis: Hours to days.  Discharge Planning:  Discharge patient once medically optimized.   Vitals:   08/02/22 0638 08/02/22 0802  BP: (!) 85/57 (!) 78/51  Pulse: 93   Resp:  18  Temp:  98.9 F (37.2 C)  SpO2: 96%     Intake/Output Summary (Last 24 hours) at 08/02/2022 4854 Last data filed at 08/02/2022 6270 Gross per 24 hour  Intake 2935.24 ml  Output --  Net 2935.24 ml   Last Weight  Most recent update: 08/02/2022  4:04 AM    Weight  50.3 kg (110 lb 14.3 oz)            Gen:  Elderly F in moderate distress HEENT: Dry mucous membranes CV: Irregular rate and rhythm  PULM: On 3LPM [place], breathing is shallow and rapid ABD: soft/nontender  EXT: No edema  Neuro: Somnolent  PPS: 10%   This conversation/these recommendations were discussed with patient primary care team, Dr. Manuella Ghazi  Billing based on MDM: High  Problems Addressed: One acute or chronic illness or injury that poses a threat to life or bodily function  Amount and/or Complexity of Data: Category 3:Discussion of management or test interpretation with external physician/other qualified health care professional/appropriate source (not separately reported)  Risks: Parenteral controlled substances, Decision regarding hospitalization or escalation of hospital care, and Decision not to resuscitate or to de-escalate care because of poor prognosis ______________________________________________________ [place] Team Team Cell Phone: [phone] Please utilize secure chat with additional questions, if there is no response within 30 minutes please call  the above phone number  Palliative Medicine Team providers are available by phone from 7am to 7pm daily and can be reached through the team cell phone.  Should this patient require assistance outside of these hours, please call the patient's attending physician.

## 2022-08-02 NOTE — Progress Notes (Signed)
PROGRESS NOTE    Caitlin Mckinney  UKG:[id number] DOB: November 29, 1950 DOA: 07/30/2022 PCP: Abran Richard, MD   Brief Narrative:    Caitlin Mckinney is a 72 year old Female patient who is apparently under hospice with extensive history of metastatic cancer presenting from skilled nursing facility with chief complaint of shortness of breath, palpitation.   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 Severe debility and SNF  Assessment & Plan:   Principal Problem:   Metastasis to bone [facility]) Active Problems:   Acute respiratory failure with hypoxia (HCC)   Essential hypertension   Mood disorder (HCC)   GERD (gastroesophageal reflux disease)   Cancer related pain   Rectal cancer (HCC)   A-fib (HCC)  Assessment and Plan: * Metastasis to bone ([place]) History of lung cancer with bone metastases -Chart indicate bone cancer, colon cancer with history of colectomy -It appears patient has getting any treatment at this point  -Palliative care consulted stat to reevaluate and establish goals of care and CODE STATUS  -Patient will family will like to resume care follow-up with oncologist as an outpatient  Acute respiratory failure with hypoxia ([place]) - Possibly due to A-fib with RVR, cannot rule out acute PE -Unable to obtain a CT angiogram due to dye allergy -Will obtain a VQ scan -Patient currently on heparin drip -Continue BiPAP -DuoNeb bronchodilators as needed -Plan to wean off supplemental oxygen on BiPAP  A-fib ([place]) - Acute new onset of A-fib with RVR in the ED overnight -status post cardioversion by EDP -Patient started on heparin drip -Trending troponin elevated to 570  -Obtaining echocardiogram repeat EKG as needed trending troponin  -Cardiology consulted by EDP -Will be admitted to [facility] progressive unit for cardiology evaluation  per family request    Rectal cancer [facility]) History of rectal cancer-metastases, last evaluation by CT scan 5//2023 -Recurrent history of colon cancer with colectomy -Lung cancer, bone cancer with metastases -Bony involvement to joint  Cancer related pain -As needed p.o. analgesics  -Along with a as needed IV analgesics-Dilaudid  GERD (gastroesophageal reflux disease) - Continue PPI  Mood disorder (HCC) - Currently indicates that patient does not take home medication of Lexapro -Will continue with home medication of Ritalin  Essential hypertension - Monitoring BP closely, with holding medication at this point -As needed hydralazine   She is now comfort care with prognosis of hours to days.       DVT prophylaxis:None Code Status: DNR/Comfort care Family Communication: Daughter at bedside 2/4 Disposition Plan:  Status is: Inpatient Remains inpatient appropriate because: Need for IV medications.   Consultants:  Palliative Care  Procedures:  None  Antimicrobials:  None   Subjective: Patient seen and evaluated today and appears comfortable with daughter at bedside.  Objective: Vitals:   08/02/22 0151 08/02/22 0359 08/02/22 0638 08/02/22 0802  BP: 100/66 (!) 80/56 (!) 85/57 (!) 78/51  Pulse: (!) 106 99 93   Resp:  17  18  Temp:  98.7 F (37.1 C)  98.9 F (37.2 C)  TempSrc:  Oral  Oral  SpO2: 91% 95% 96%   Weight:  50.3 kg    Height:        Intake/Output Summary (Last 24 hours) at 08/02/2022 1526 Last data filed at 08/02/2022 1446 Gross per 24 hour  Intake 3502.03 ml  Output --  Net 3502.03 ml   [employer]  08/01/2022 1903 08/01/22 1556 08/02/22 0359  Weight: 45.4 kg 48.6 kg 50.3 kg    Examination:  General exam: Appears calm and comfortable, unresponsive Respiratory system: Clear to auscultation. Respiratory effort normal.[place] Cardiovascular system: S1 & S2 heard, RRR.  Gastrointestinal system: Abdomen is soft Central nervous system: Alert and  awake Extremities: No edema Skin: No significant lesions noted Psychiatry: Flat affect.    Data Reviewed: I have personally reviewed following labs and imaging studies  CBC: Recent Labs  Lab 08/10/2022 2000 08/02/22 0021  WBC 9.1 12.7*  NEUTROABS 7.2  --   HGB 12.7 12.1  HCT 39.3 36.4  MCV 83.1 83.3  PLT 489* 878   Basic Metabolic Panel: Recent Labs  Lab 08/05/2022 2000 08/01/22 0835 08/02/22 0021  NA 129*  --  133*  K 3.8  --  3.5  CL 93*  --  101  CO2 24  --  20*  GLUCOSE 105*  --  83  BUN 14  --  16  CREATININE 0.44  --  0.73  CALCIUM 8.3*  --  8.1*  MG  --  1.7  --   PHOS  --  3.6  --    GFR: Estimated Creatinine Clearance: 45.7 mL/min (by C-G formula based on SCr of 0.73 mg/dL). Liver Function Tests: Recent Labs  Lab 07/30/2022 2000  AST 38  ALT 16  ALKPHOS 129*  BILITOT 0.9  PROT 7.2  ALBUMIN 2.8*   No results for input(s): "LIPASE", "AMYLASE" in the last 168 hours. No results for input(s): "AMMONIA" in the last 168 hours. Coagulation Profile: Recent Labs  Lab 08/01/22 0835 08/02/22 0021  INR 1.3* 1.3*   Cardiac Enzymes: No results for input(s): "CKTOTAL", "CKMB", "CKMBINDEX", "TROPONINI" in the last 168 hours. BNP (last 3 results) No results for input(s): "PROBNP" in the last 8760 hours. HbA1C: No results for input(s): "HGBA1C" in the last 72 hours. CBG: Recent Labs  Lab 08/02/22 0543 08/02/22 0759  GLUCAP 82 76   Lipid Profile: No results for input(s): "CHOL", "HDL", "LDLCALC", "TRIG", "CHOLHDL", "LDLDIRECT" in the last 72 hours. Thyroid Function Tests: Recent Labs    08/01/22 0916  TSH 4.423   Anemia Panel: No results for input(s): "VITAMINB12", "FOLATE", "FERRITIN", "TIBC", "IRON", "RETICCTPCT" in the last 72 hours. Sepsis Labs: No results for input(s): "PROCALCITON", "LATICACIDVEN" in the last 168 hours.  No results found for this or any previous visit (from the past 240 hour(s)).       Radiology Studies: DG Chest Port  1 View  Result Date: 08/01/2022 CLINICAL DATA:  Shortness of breath. EXAM: PORTABLE CHEST 1 VIEW COMPARISON:  08/16/2022. FINDINGS: The heart size and mediastinal contours are stable. There is atherosclerotic calcification of the aorta. Multiple nodules and masses are present in the lungs bilaterally, not significantly changed from the prior exam. No effusion or pneumothorax. A left chest port is stable in position. There is bony deformity of the medial right clavicle, compatible with known metastasis. IMPRESSION: Multiple nodules and masses in the lungs and right clavicular lesion, compatible with known metastatic disease and unchanged from the prior exam. Electronically Signed   By: Brett Fairy M.D.   On: 08/01/2022 02:33   CT Soft Tissue Neck Wo Contrast  Result Date: 08/01/2022 CLINICAL DATA:  Difficulty swallowing history of metastatic colon cancer EXAM: CT NECK WITHOUT CONTRAST TECHNIQUE: Multidetector CT imaging of the neck was performed following the standard protocol without intravenous contrast. RADIATION DOSE REDUCTION: This exam was performed according to the departmental  dose-optimization program which includes automated exposure control, adjustment of the mA and/or kV according to patient size and/or use of iterative reconstruction technique. COMPARISON:  None Available. FINDINGS: Pharynx and larynx: Normal. No mass or swelling. Salivary glands: No inflammation, mass, or stone. Thyroid: Normal. Lymph nodes: None enlarged or abnormal density. Vascular: Atherosclerotic calcification at the aortic arch and carotid bifurcations. Limited intracranial: Negative. Visualized orbits: Negative. Mastoids and visualized paranasal sinuses: Clear. Skeleton: Sclerotic lesion of the right lateral mass of C1, likely a bone island. Large mass of the medial right clavicle with large soft tissue component. Upper chest: Multiple apical pulmonary masses, better characterized on concomitant chest CT. Other: None  IMPRESSION: 1. No finding to explain the reported dysphagia. 2. Large mass of the medial right clavicle with large soft tissue component, consistent with metastatic disease. 3. Multiple apical pulmonary masses, better characterized on concomitant chest CT. Aortic Atherosclerosis (ICD10-I70.0). Electronically Signed   By: Ulyses Jarred M.D.   On: 08/01/2022 00:19   CT Chest Wo Contrast  Result Date: 08/01/2022 CLINICAL DATA:  Colon cancer to assess treatment response. Difficulty swallowing for 2 weeks. EXAM: CT CHEST WITHOUT CONTRAST TECHNIQUE: Multidetector CT imaging of the chest was performed following the standard protocol without IV contrast. RADIATION DOSE REDUCTION: This exam was performed according to the departmental dose-optimization program which includes automated exposure control, adjustment of the mA and/or kV according to patient size and/or use of iterative reconstruction technique. COMPARISON:  06/17/2022 FINDINGS: Cardiovascular: Central venous catheter with tip at the cavoatrial junction. Normal heart size. No pericardial effusions. Normal caliber thoracic aorta. Calcification of the aorta and coronary arteries. Mediastinum/Nodes: Thyroid gland is unremarkable. Esophagus is decompressed. Bulky lymphadenopathy throughout the mediastinum and in both hilar regions. Largest lymph nodes are seen in the right paratracheal region, measuring 3.5 cm in diameter. This is similar to prior study. Lungs/Pleura: Multiple bilateral pulmonary metastasis again demonstrated. Individual metastatic lesions measure up to about 4 cm diameter. Measurements are similar to previous study although overall the tumor burden appears to be increasing. Previous cavitary areas are no longer demonstrating cavitation. No pleural effusion. No pneumothorax. Upper Abdomen: Large mass in the left suprarenal space measuring up to 6.2 cm diameter probably representing an adrenal gland mass although could be a lymph node. Measurement  is mildly enlarged since prior study. Right adrenal gland nodule measuring 2.4 cm diameter. Calcified granulomas in the spleen. Musculoskeletal: A large destructive and sclerotic metastatic focus is demonstrated to involve the head of the clavicle on the right. This measures about 6.5 cm maximal diameter, mildly enlarged since prior study. Compression of the T11 vertebra is unchanged. IMPRESSION: Diffuse metastatic disease, including numerous bilateral pulmonary metastasis, prominent mediastinal lymphadenopathy, bilateral adrenal gland metastases, and large right clavicular lesion. Some lesions appear mildly enlarged since prior study. Electronically Signed   By: Lucienne Capers M.D.   On: 08/01/2022 00:17   DG Chest 2 View  Result Date: 07/30/2022 CLINICAL DATA:  Short of breath, history of metastatic rectal cancer, concern for foreign body EXAM: CHEST - 2 VIEW COMPARISON:  06/18/2022 FINDINGS: Frontal and lateral views of the chest demonstrate a stable left chest wall port via internal jugular approach tip within the region of the superior vena cava. Cardiac silhouette is unchanged. Interval enlargement of the bilateral pulmonary metastases seen on prior exam consistent with progression of disease. No acute airspace disease, effusion, or pneumothorax. Chronic elevation left hemidiaphragm. Chronic compression deformity at the thoracolumbar junction. Stable postsurgical changes left shoulder. IMPRESSION: 1. Enlarging  bilateral pulmonary metastases consistent with progression of disease. 2. No evidence of unexpected radiopaque foreign body. Electronically Signed   By: Randa Ngo M.D.   On: 08/22/2022 19:30        Scheduled Meds:   HYDROmorphone (DILAUDID) injection  0.5 mg Intravenous Q4H     LOS: 1 day    Time spent: 35 minutes    Filemon Breton Darleen Crocker, DO [facility]  If 7PM-7AM, please contact night-coverage [email or web address] 08/02/2022, 3:26 PM

## 2022-08-02 NOTE — Progress Notes (Signed)
ANTICOAGULATION CONSULT NOTE -Follow up   Pharmacy Consult for heparin Indication: chest pain/ACS  Allergies  Allergen Reactions   Contrast Media [Iodinated Contrast Media] Shortness Of Breath   Other     States her blood pressure medicine made her cough    Allevess [Capsaicin-Menthol] Rash   Erythromycin Rash   Naproxen Rash    Patient Measurements: Height: 5' (152.4 cm) Weight: 48.6 kg (107 lb 2.3 oz) IBW/kg (Calculated) : 45.5 Heparin Dosing Weight: 48.6kg  Vital Signs: Temp: 99.2 F (37.3 C) (02/03 2356) Temp Source: Oral (02/03 2356) BP: 89/58 (02/04 0000) Pulse Rate: 103 (02/04 0000)  Labs: Recent Labs    08/10/2022 2000 08/01/22 0324 08/01/22 0835 08/01/22 0916 08/01/22 1158 08/01/22 1610 08/02/22 0019 08/02/22 0021  HGB 12.7  --   --   --   --   --   --  12.1  HCT 39.3  --   --   --   --   --   --  36.4  PLT 489*  --   --   --   --   --   --  385  APTT  --   --   --   --   --   --   --  44*  LABPROT  --   --  16.5*  --   --   --   --  16.2*  INR  --   --  1.3*  --   --   --   --  1.3*  HEPARINUNFRC  --   --   --   --   --  <0.10* <0.10*  --   CREATININE 0.44  --   --   --   --   --   --  0.73  TROPONINIHS  --  570*  --  552* 344*  --   --   --      Estimated Creatinine Clearance: 45.7 mL/min (by C-G formula based on SCr of 0.73 mg/dL).   Medical History: Past Medical History:  Diagnosis Date   Cancer [facility])    endometrial and colon 2010   Hypertension     Assessment: Pt developed short of breath and increase HR pending discharge. Heparin ordered to r/o MI  The  8 hr HL <0.1 on heparin  650 units/hr. No issues or interruptions with heparin infusion and no bleeding per RN.  RN reports hepairn drip was infusing at correct rate when patient arrived from [facility].  Patient has h/o  colon cancer with metastatic to bone.  Noted patient had acute onset of afib with RVR at [facility].  MD has ordered VQ scan to rule out for PE.  Not on anticoagulation  PTA,  although as some point patient was receiving lovenox 40mg  q24h  x28 days (starting 03/31/22)- reported as not taking PTA at [facility].   Heparin level came back undetectable again. We will re-bolus and increase rate.  Goal of Therapy:  Heparin level 0.3-0.7 units/ml Monitor platelets by anticoagulation protocol: Yes   Plan:  Heparin bolus 2500 units x1 Heparin infusion 950 units/hr Check 8 hr HL and CBC Then daily HL and CBC  Onnie Boer, PharmD, BCIDP, AAHIVP, CPP Infectious Disease Pharmacist 08/02/2022 1:31 AM

## 2022-08-02 NOTE — Progress Notes (Signed)
BP 70's/50's (MAP 50's) after 1mg  dilaudid dose given for severe generalized pain. Chen, DO paged, orders received for 500 mL LR bolus at 250 mL/h (currently infusing). Dayshift RN to be updated at handoff.

## 2022-08-03 DIAGNOSIS — C7951 Secondary malignant neoplasm of bone: Secondary | ICD-10-CM | POA: Diagnosis not present

## 2022-08-28 NOTE — Death Summary Note (Signed)
DEATH SUMMARY   Patient Details  Name: Caitlin Mckinney MRN: [id number] DOB: Jul 14, 1950 MMH:[id number], Langley Gauss, MD Admission/Discharge Information   Admit Date:  August 11, 2022  Date of Death: Date of Death: 08-14-22  Time of Death: Time of Death: 99  Length of Stay: 2   Principle Cause of death: Acute hypoxemic respiratory failure possibly related to PE in the setting of rectal cancer with metastatic disease.  Hospital Diagnoses: Principal Problem:   Metastasis to bone [facility]) Active Problems:   Acute respiratory failure with hypoxia (HCC)   Essential hypertension   Mood disorder (HCC)   GERD (gastroesophageal reflux disease)   Cancer related pain   Rectal cancer (HCC)   A-fib [facility])   Hospital Course: Caitlin Mckinney is a 72 year old Female patient who is apparently under hospice with extensive history of metastatic cancer presenting from skilled nursing facility with chief complaint of shortness of breath, palpitation.  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 Severe debility and SNF     Course ED: Vitals:   08/01/22 0705 08/01/22 0715  BP: 97/61 97/64  Pulse: 97 97  Resp: (!) 21 (!) 21  Temp:    SpO2: 98% 98%  significant worsening SOB and dyspnea, O2 sat dropping to 82% on her baseline 2L. Increased O2 to 4  On BiPAP ABG    Component Value Date/Time   PHART 7.4 08/01/2022 0701   PCO2ART 36 08/01/2022 0701   PO2ART 138 (H) 08/01/2022 0701   HCO3 22.3 08/01/2022 0701   ACIDBASEDEF 2.0 08/01/2022 0701   O2SAT 99.8 08/01/2022 0701   Na 129,  K 3.8,  Trop: 570  -Patient was initially seen and evaluated in ED for mild dysphagia-she suddenly progressed to worsening shortness of breath, heart rate jumped 260 patient went to A-fib with RVR -Subsequently ED staff stabilize the patient with fluid, subsequently cardioversion by EDP  provider Dr. Madaline Brilliant -Subsequent workup noted for elevated troponin avidly  -Patient started on heparin drip  -ED providers and I have called patient's son: Who confirmed that now patient is full code, would like to pursue full scope of care including in person cardiac evaluation by cardiologist  It was explained that the patient needs to be transferred to [facility] for cardiology evaluation.  EDP was made aware that cardiology consultation is needed  Assessment and Plan: * Metastasis to bone [facility]) History of lung cancer with bone metastases -Chart indicate bone cancer, colon cancer with history of colectomy -It appears patient has getting any treatment at this point  -Palliative care consulted stat to reevaluate and establish goals of care and CODE STATUS  -Patient will family will like to resume care follow-up with oncologist as an outpatient  Acute respiratory failure with hypoxia ([place]) - Possibly due to A-fib with RVR, cannot rule out acute PE -Unable to obtain a CT angiogram due to dye allergy -Will obtain a VQ scan -Patient currently on heparin drip -Continue BiPAP -DuoNeb bronchodilators as needed -Plan to wean off supplemental oxygen on BiPAP  A-fib ([place]) - Acute new onset of A-fib with RVR in the ED overnight -status post cardioversion by EDP -Patient started on heparin drip -Trending troponin elevated to 570  -Obtaining echocardiogram repeat EKG as needed trending troponin  -Cardiology consulted by EDP -Will be admitted to [facility] progressive unit for cardiology evaluation per family request    Rectal cancer (  [place]) History of rectal cancer-metastases, last evaluation by CT scan 5//2023 -Recurrent history of colon cancer with colectomy -Lung cancer, bone cancer with metastases -Bony involvement to joint  Cancer related pain -As needed p.o. analgesics  -Along with a as needed IV analgesics-Dilaudid  GERD (gastroesophageal reflux disease) -  Continue PPI  Mood disorder ([place]) - Currently indicates that patient does not take home medication of Lexapro -Will continue with home medication of Ritalin  Essential hypertension - Monitoring BP closely, with holding medication at this point -As needed hydralazine   Patient was made comfort care upon transfer to [facility] by palliative care on 2/4. She was noted to be actively dying and family was at bedside. She expired on 08-20-2022 at 0459.  Procedures: None  Consultations: Cardiology, Palliative Care  The results of significant diagnostics from this hospitalization (including imaging, microbiology, ancillary and laboratory) are listed below for reference.   Significant Diagnostic Studies: DG Chest Port 1 View  Result Date: 08/01/2022 CLINICAL DATA:  Shortness of breath. EXAM: PORTABLE CHEST 1 VIEW COMPARISON:  08/19/2022. FINDINGS: The heart size and mediastinal contours are stable. There is atherosclerotic calcification of the aorta. Multiple nodules and masses are present in the lungs bilaterally, not significantly changed from the prior exam. No effusion or pneumothorax. A left chest port is stable in position. There is bony deformity of the medial right clavicle, compatible with known metastasis. IMPRESSION: Multiple nodules and masses in the lungs and right clavicular lesion, compatible with known metastatic disease and unchanged from the prior exam. Electronically Signed   By: Brett Fairy M.D.   On: 08/01/2022 02:33   CT Soft Tissue Neck Wo Contrast  Result Date: 08/01/2022 CLINICAL DATA:  Difficulty swallowing history of metastatic colon cancer EXAM: CT NECK WITHOUT CONTRAST TECHNIQUE: Multidetector CT imaging of the neck was performed following the standard protocol without intravenous contrast. RADIATION DOSE REDUCTION: This exam was performed according to the departmental dose-optimization program which includes automated exposure control, adjustment of the mA and/or kV  according to patient size and/or use of iterative reconstruction technique. COMPARISON:  None Available. FINDINGS: Pharynx and larynx: Normal. No mass or swelling. Salivary glands: No inflammation, mass, or stone. Thyroid: Normal. Lymph nodes: None enlarged or abnormal density. Vascular: Atherosclerotic calcification at the aortic arch and carotid bifurcations. Limited intracranial: Negative. Visualized orbits: Negative. Mastoids and visualized paranasal sinuses: Clear. Skeleton: Sclerotic lesion of the right lateral mass of C1, likely a bone island. Large mass of the medial right clavicle with large soft tissue component. Upper chest: Multiple apical pulmonary masses, better characterized on concomitant chest CT. Other: None IMPRESSION: 1. No finding to explain the reported dysphagia. 2. Large mass of the medial right clavicle with large soft tissue component, consistent with metastatic disease. 3. Multiple apical pulmonary masses, better characterized on concomitant chest CT. Aortic Atherosclerosis (ICD10-I70.0). Electronically Signed   By: Ulyses Jarred M.D.   On: 08/01/2022 00:19   CT Chest Wo Contrast  Result Date: 08/01/2022 CLINICAL DATA:  Colon cancer to assess treatment response. Difficulty swallowing for 2 weeks. EXAM: CT CHEST WITHOUT CONTRAST TECHNIQUE: Multidetector CT imaging of the chest was performed following the standard protocol without IV contrast. RADIATION DOSE REDUCTION: This exam was performed according to the departmental dose-optimization program which includes automated exposure control, adjustment of the mA and/or kV according to patient size and/or use of iterative reconstruction technique. COMPARISON:  06/17/2022 FINDINGS: Cardiovascular: Central venous catheter with tip at the cavoatrial junction. Normal heart size. No pericardial effusions.  Normal caliber thoracic aorta. Calcification of the aorta and coronary arteries. Mediastinum/Nodes: Thyroid gland is unremarkable. Esophagus is  decompressed. Bulky lymphadenopathy throughout the mediastinum and in both hilar regions. Largest lymph nodes are seen in the right paratracheal region, measuring 3.5 cm in diameter. This is similar to prior study. Lungs/Pleura: Multiple bilateral pulmonary metastasis again demonstrated. Individual metastatic lesions measure up to about 4 cm diameter. Measurements are similar to previous study although overall the tumor burden appears to be increasing. Previous cavitary areas are no longer demonstrating cavitation. No pleural effusion. No pneumothorax. Upper Abdomen: Large mass in the left suprarenal space measuring up to 6.2 cm diameter probably representing an adrenal gland mass although could be a lymph node. Measurement is mildly enlarged since prior study. Right adrenal gland nodule measuring 2.4 cm diameter. Calcified granulomas in the spleen. Musculoskeletal: A large destructive and sclerotic metastatic focus is demonstrated to involve the head of the clavicle on the right. This measures about 6.5 cm maximal diameter, mildly enlarged since prior study. Compression of the T11 vertebra is unchanged. IMPRESSION: Diffuse metastatic disease, including numerous bilateral pulmonary metastasis, prominent mediastinal lymphadenopathy, bilateral adrenal gland metastases, and large right clavicular lesion. Some lesions appear mildly enlarged since prior study. Electronically Signed   By: Lucienne Capers M.D.   On: 08/01/2022 00:17   DG Chest 2 View  Result Date: 08/09/2022 CLINICAL DATA:  Short of breath, history of metastatic rectal cancer, concern for foreign body EXAM: CHEST - 2 VIEW COMPARISON:  06/18/2022 FINDINGS: Frontal and lateral views of the chest demonstrate a stable left chest wall port via internal jugular approach tip within the region of the superior vena cava. Cardiac silhouette is unchanged. Interval enlargement of the bilateral pulmonary metastases seen on prior exam consistent with progression of  disease. No acute airspace disease, effusion, or pneumothorax. Chronic elevation left hemidiaphragm. Chronic compression deformity at the thoracolumbar junction. Stable postsurgical changes left shoulder. IMPRESSION: 1. Enlarging bilateral pulmonary metastases consistent with progression of disease. 2. No evidence of unexpected radiopaque foreign body. Electronically Signed   By: Randa Ngo M.D.   On: 08/10/2022 19:30    Microbiology: No results found for this or any previous visit (from the past 240 hour(s)).  Time spent: 30 minutes  Signed: Rodena Goldmann, DO 09-01-22

## 2022-08-28 NOTE — Progress Notes (Signed)
Pt expired at 4:59 on 08/23/2022. Verified by Glenna Durand, RN & Yetta Glassman, RN. Pt's daughter Orson Slick at bedside. On call provider Kristopher Oppenheim, DO notified via page .

## 2022-08-28 NOTE — Progress Notes (Signed)
    OVERNIGHT PROGRESS REPORT  Notified by RN that patient has expired at 04:59 on 13-Aug-2022  Patient was comfort care  2 RN verified.  Family was available to RN.  Kristopher Oppenheim, DO [facility]

## 2022-08-28 DEATH — deceased
